# Patient Record
Sex: Female | Born: 1970 | Race: White | Hispanic: No | Marital: Married | Smoking: Never smoker
Health system: Southern US, Community
[De-identification: ages and names within clinical notes are randomized; demographics above are authoritative.]

## PROBLEM LIST (undated history)

## (undated) DIAGNOSIS — R569 Unspecified convulsions: Secondary | ICD-10-CM

## (undated) DIAGNOSIS — C4490 Unspecified malignant neoplasm of skin, unspecified: Secondary | ICD-10-CM

## (undated) DIAGNOSIS — T7840XA Allergy, unspecified, initial encounter: Secondary | ICD-10-CM

## (undated) DIAGNOSIS — D649 Anemia, unspecified: Secondary | ICD-10-CM

## (undated) DIAGNOSIS — J45909 Unspecified asthma, uncomplicated: Secondary | ICD-10-CM

## (undated) DIAGNOSIS — G43909 Migraine, unspecified, not intractable, without status migrainosus: Secondary | ICD-10-CM

## (undated) DIAGNOSIS — F419 Anxiety disorder, unspecified: Secondary | ICD-10-CM

## (undated) DIAGNOSIS — D689 Coagulation defect, unspecified: Secondary | ICD-10-CM

## (undated) DIAGNOSIS — K219 Gastro-esophageal reflux disease without esophagitis: Secondary | ICD-10-CM

## (undated) HISTORY — DX: Migraine, unspecified, not intractable, without status migrainosus: G43.909

## (undated) HISTORY — DX: Anxiety disorder, unspecified: F41.9

## (undated) HISTORY — DX: Gastro-esophageal reflux disease without esophagitis: K21.9

## (undated) HISTORY — DX: Unspecified malignant neoplasm of skin, unspecified: C44.90

## (undated) HISTORY — PX: TUBAL LIGATION: SHX77

## (undated) HISTORY — DX: Coagulation defect, unspecified: D68.9

## (undated) HISTORY — PX: ANKLE SURGERY: SHX546

## (undated) HISTORY — DX: Allergy, unspecified, initial encounter: T78.40XA

## (undated) HISTORY — PX: BASAL CELL CARCINOMA EXCISION: SHX1214

## (undated) HISTORY — DX: Unspecified asthma, uncomplicated: J45.909

## (undated) HISTORY — PX: NECK SURGERY: SHX720

## (undated) HISTORY — PX: ENDOMETRIAL ABLATION: SHX621

## (undated) HISTORY — DX: Anemia, unspecified: D64.9

## (undated) HISTORY — DX: Unspecified convulsions: R56.9

## (undated) HISTORY — PX: EXPLORATORY LAPAROTOMY: SUR591

---

## 2017-06-17 HISTORY — PX: CERVICAL SPINE SURGERY: SHX589

## 2018-02-21 ENCOUNTER — Encounter: Payer: Self-pay | Admitting: Diagnostic Neuroimaging

## 2018-02-25 ENCOUNTER — Ambulatory Visit: Payer: No Typology Code available for payment source | Admitting: Diagnostic Neuroimaging

## 2018-02-25 ENCOUNTER — Encounter: Payer: Self-pay | Admitting: Diagnostic Neuroimaging

## 2018-02-25 VITALS — BP 113/65 | HR 66 | Ht 63.0 in | Wt 123.2 lb

## 2018-02-25 DIAGNOSIS — F419 Anxiety disorder, unspecified: Secondary | ICD-10-CM | POA: Diagnosis not present

## 2018-02-25 DIAGNOSIS — M797 Fibromyalgia: Secondary | ICD-10-CM | POA: Diagnosis not present

## 2018-02-25 DIAGNOSIS — R200 Anesthesia of skin: Secondary | ICD-10-CM

## 2018-02-25 DIAGNOSIS — R413 Other amnesia: Secondary | ICD-10-CM

## 2018-02-25 NOTE — Progress Notes (Signed)
[facility]  PATIENT: Michele Ali DOB: 07-22-1971  REFERRING CLINICIAN: R York, NP HISTORY FROM: patient and chart review  REASON FOR VISIT: new consult    HISTORICAL  CHIEF COMPLAINT:  Chief Complaint  Patient presents with  . NP  Michele Scales NP  . cognitive impairment    memory loss, some shakiness UE and LE, Had sz [phone], took off meds.   . Migraine    HISTORY OF PRESENT ILLNESS:   47 year old female with depression, anxiety, fibromyalgia, migraine, here for evaluation of constellation of symptoms including memory loss, numbness and tingling, fatigue, tremor.  Patient had onset of symptoms around 2013-2014, when she was under high stress related to her child who had cancer diagnosis, as well as her in-laws who were having medical and stress problems.  By 2015 she started to have some cognitive difficulty, memory loss, extreme fatigue, intermittent tremors.  Patient has been on antidepressant and antianxiety medication by PCP, but has not seen psychiatry or psychology.  Patient also has history of seizures in the past (1979-1983) but no seizures recently.  Also history of headaches in the past but headaches have improved recently.  Patient following up here just to make sure that she does not have an alternate neurologic diagnosis.     REVIEW OF SYSTEMS: Full 14 system review of systems performed and negative with exception of: Memory loss confusion headache numbness weakness slurred speech dizziness restless legs depression anxiety decreased energy racing thoughts allergies runny nose urination problems joint pain feeling cold cough wheezing blurred vision double vision loss of vision eye pain fatigue palpitations spinning sensation.  ALLERGIES: Allergies  Allergen Reactions  . Latex   . Penicillins   . Sulfa Antibiotics     HOME MEDICATIONS: Outpatient Medications Prior to Visit  Medication Sig Dispense Refill  . ALBUTEROL SULFATE HFA IN  Inhale into the lungs as needed.    . Cholecalciferol (VITAMIN D-1000 MAX ST) 1000 units tablet Take 1,000 Units by mouth daily.     . clonazePAM (KLONOPIN) 0.5 MG tablet Take 0.5 mg by mouth 3 (three) times daily.    . fluticasone (FLONASE) 50 MCG/ACT nasal spray Place into the nose.    . loratadine (CLARITIN) 10 MG tablet Take 10 mg by mouth daily.    . meloxicam (MOBIC) 15 MG tablet takes daily  3  . methocarbamol (ROBAXIN) 750 MG tablet Take 750 mg by mouth as needed.     . Multiple Vitamin (MULTI-VITAMINS) TABS Take by mouth daily.     Marland Kitchen omeprazole (PRILOSEC) 20 MG capsule Take 20 mg by mouth daily.    . Ondansetron HCl (ZOFRAN PO) Take by mouth. Takes one prn nausea    . sertraline (ZOLOFT) 100 MG tablet Take 100 mg by mouth daily.    Marland Kitchen gabapentin (NEURONTIN) 300 MG capsule Take 300 mg by mouth 3 (three) times daily. 1 in am, 1 in afternoon, 2 at bedtime     No facility-administered medications prior to visit.     PAST MEDICAL HISTORY: Past Medical History:  Diagnosis Date  . Migraine   . Skin cancer    basal    PAST SURGICAL HISTORY: Past Surgical History:  Procedure Laterality Date  . ANKLE SURGERY Right   . BASAL CELL CARCINOMA EXCISION     2016  left flank,  . CERVICAL SPINE SURGERY  06/2017   C5-C7  . EXPLORATORY LAPAROTOMY    . NECK SURGERY     "lumpectomy"  FAMILY HISTORY: Family History  Problem Relation Age of Onset  . Crohn's disease Father   . Dementia Father   . Atrial fibrillation Father   . Heart disease Mother   . Cancer Maternal Grandmother   . Cancer Paternal Grandmother   . Pancreatic disease Paternal Grandfather     SOCIAL HISTORY:  Social History   Socioeconomic History  . Marital status: Married    Spouse name: Not on file  . Number of children: Not on file  . Years of education: Not on file  . Highest education level: Not on file  Social Needs  . Financial resource strain: Not on file  . Food insecurity - worry: Not on file    . Food insecurity - inability: Not on file  . Transportation needs - medical: Not on file  . Transportation needs - non-medical: Not on file  Occupational History  . Not on file  Tobacco Use  . Smoking status: Never Smoker  . Smokeless tobacco: Never Used  Substance and Sexual Activity  . Alcohol use: Yes    Frequency: Never    Comment: rarely  . Drug use: No  . Sexual activity: Not on file  Other Topics Concern  . Not on file  Social History Narrative   Lives at home, with husband and 2 kids and pets.  Works at [employer].  Michele Ali is spouse.  Caffeine 1 cup coffee daily.     PHYSICAL EXAM  GENERAL EXAM/CONSTITUTIONAL: Vitals:  Vitals:   02/25/18 1020  BP: 113/65  Pulse: 66  Weight: 123 lb 3.2 oz (55.9 kg)  Height: 5\' 3"  (1.6 m)     Body mass index is 21.82 kg/m.  Visual Acuity Screening   Right eye Left eye Both eyes  Without correction: 20/30 20/40   With correction:        Patient is in no distress; well developed, nourished and groomed; neck is supple  CARDIOVASCULAR:  Examination of carotid arteries is normal; no carotid bruits  Regular rate and rhythm, no murmurs  Examination of peripheral vascular system by observation and palpation is normal  EYES:  Ophthalmoscopic exam of optic discs and posterior segments is normal; no papilledema or hemorrhages  MUSCULOSKELETAL:  Gait, strength, tone, movements noted in Neurologic exam below  NEUROLOGIC: MENTAL STATUS:  MMSE - Mini Mental State Exam 02/25/2018  Orientation to time 5  Orientation to Place 4  Registration 3  Attention/ Calculation 5  Recall 1  Language- name 2 objects 2  Language- repeat 1  Language- follow 3 step command 2  Language- read & follow direction 1  Write a sentence 1  Copy design 1  Total score 26    awake, alert, oriented to person, place and time  recent and remote memory intact  normal attention and concentration  language fluent, comprehension  intact, naming intact,   fund of knowledge appropriate  CRANIAL NERVE:   2nd - no papilledema on fundoscopic exam  2nd, 3rd, 4th, 6th - pupils equal and reactive to light, visual fields full to confrontation, extraocular muscles intact, no nystagmus  5th - facial sensation symmetric  7th - facial strength symmetric  8th - hearing intact  9th - palate elevates symmetrically, uvula midline  11th - shoulder shrug symmetric  12th - tongue protrusion midline  MOTOR:   normal bulk and tone, full strength in the BUE, BLE  MINIMAL POSTURAL TREMOR  SENSORY:   normal and symmetric to light touch, temperature, vibration; DECR  TEMP IN LEFT HAND; DECR VIB IN RIGHT HAND  COORDINATION:   finger-nose-finger, fine finger movements normal  REFLEXES:   deep tendon reflexes present and symmetric  GAIT/STATION:   narrow based gait    DIAGNOSTIC DATA (LABS, IMAGING, TESTING) - I reviewed patient records, labs, notes, testing and imaging myself where available.  No results found for: WBC, HGB, HCT, MCV, PLT No results found for: NA, K, CL, CO2, GLUCOSE, BUN, CREATININE, CALCIUM, PROT, ALBUMIN, AST, ALT, ALKPHOS, BILITOT, GFRNONAA, GFRAA No results found for: CHOL, HDL, LDLCALC, LDLDIRECT, TRIG, CHOLHDL No results found for: HGBA1C No results found for: VITAMINB12 No results found for: TSH  09/02/15 MRI cervical spine [I reviewed images myself and agree with interpretation. -VRP]  1. The most significant disease is at C6-7 with moderate right and mild left foraminal stenosis. This potentially affects the C7 nerve roots. 2. Mild right foraminal narrowing at C5-6. 3. Mild central canal narrowing at C5-6 and C6-7 due to broad-based disc osteophyte complex at each level.  12/13/17 MRI brain [I reviewed images myself and agree with interpretation. -VRP]  1. No acute intracranial process. 2. Mild nonspecific white matter changes, slightly progressed from 2015 associated with chronic  small vessel ischemic disease, and migraines.    ASSESSMENT AND PLAN  47 y.o. year old female here with intermittent numbness, weakness, tremor, memory loss, pain, anxiety, in setting of increased psychosocial stressors.  Neurologic examination unremarkable.  MRI of the brain and cervical spine otherwise unremarkable.  We will check additional lab testing to rule out secondary causes.  Also recommend to follow-up with psychiatry or psychology to review better treatment of anxiety and stress factors.  Dx:  1. Numbness   2. Memory loss   3. Anxiety   4. Fibromyalgia     PLAN:  - check B12, TSH, A1c - consider psychology evaluation  Orders Placed This Encounter  Procedures  . Vitamin B12  . Hemoglobin A1c  . TSH   Return if symptoms worsen or fail to improve, for return to PCP.    Penni Bombard, MD 0/25/8527, 78:24 AM Certified in Neurology, Neurophysiology and Neuroimaging  [facility] [address] [place], [place] [address] [phone]

## 2018-02-26 ENCOUNTER — Telehealth: Payer: Self-pay | Admitting: *Deleted

## 2018-02-26 LAB — TSH: TSH: 1.64 u[IU]/mL (ref 0.450–4.500)

## 2018-02-26 LAB — HEMOGLOBIN A1C
ESTIMATED AVERAGE GLUCOSE: 103 mg/dL
Hgb A1c MFr Bld: 5.2 % (ref 4.8–5.6)

## 2018-02-26 LAB — VITAMIN B12: Vitamin B-12: 410 pg/mL (ref 232–1245)

## 2018-02-26 NOTE — Telephone Encounter (Signed)
-----   Message from Penni Bombard, MD sent at 02/26/2018 12:37 PM EDT ----- Normal labs. Please call patient. -VRP

## 2018-02-26 NOTE — Telephone Encounter (Signed)
Spoke to pt and relayed that her lab results were normal.  She asked about her MRI's being read by Dr Leta Baptist and I noted from ofv note he did review images and agreed with interpretation. Pt informed and she verbalized understanding of all above.

## 2020-01-29 NOTE — Progress Notes (Signed)
01/29/2020 Donalda Petree [id number] 1971-05-11   HISTORY OF PRESENT ILLNESS:  Leelani Nedley is a 49 year old female with a past medical history of depression, anxiety, fibromyalgia, generative disc disease and migraine headaches. Past tubal ligation and cervical spine fusion. She developed abrupt onset of lower abdominal pain while cleaning her bathroom on 01/18/2020. She felt nauseous without vomiting. She described her lower abdominal pain as excruciating. No specific food triggers. No fever or sweats. Her abdominal pain persisted and went to the urgent care clinic on Tuesday 01/20/2020. She was diagnosed with diverticulitis and she was prescribed Cipro 500mg  po bid and Flagyl 500mg  po tid x 10 days. Urinalysis was normal, no other labs were done. Her abdominal pain gradually improved. No further severe lower abdominal pain, however, she is having some cramping discomfort after eating. She is passing 2 to 4 soft formed stools daily since taking the antibiotics. She finished the Cipro and Flagyl on 2/11. No diarrhea. No rectal bleeding or melena. No mucous per the rectum. She has lost 5lbs over the past 3 weeks. She reports having a history of GERD. She stated she had reflux as an infant. She underwent an EGD at the age of 10 which showed acid reflux. She takes Omeprazole 40mg  QOD but she stopped taking it when she developed lower abdominal pain as documented above. She has occasional heartburn. She takes Meloxicam 15mg  once daily x 2 years for neck pain and fibromyalgia pain. No other NSAIDs. She reports having a history of anemia, further details unclear. Father with history of Crohn's disease. Mother with history of diverticulitis. Paternal uncle and paternal cousins with colon cancer. Maternal and paternal grandmother with breast cancer and paternal grandfather with history of pancreatic cancer.    Past Medical History:  Diagnosis Date  . Migraine   . Skin cancer    basal   Past Surgical History:    Procedure Laterality Date  . ANKLE SURGERY Right   . BASAL CELL CARCINOMA EXCISION     2016  left flank,  . CERVICAL SPINE SURGERY  06/2017   C5-C7  . EXPLORATORY LAPAROTOMY    . NECK SURGERY     "lumpectomy"    reports that she has never smoked. She has never used smokeless tobacco. She reports current alcohol use. She reports that she does not use drugs. family history includes Atrial fibrillation in her father; Cancer in her maternal grandmother and paternal grandmother; Crohn's disease in her father; Dementia in her father; Heart disease in her mother; Pancreatic disease in her paternal grandfather. Allergies  Allergen Reactions  . Latex   . Penicillins   . Sulfa Antibiotics       Outpatient Encounter Medications as of 01/30/2020  Medication Sig  . ALBUTEROL SULFATE HFA IN Inhale into the lungs as needed.  . Cholecalciferol (VITAMIN D-1000 MAX ST) 1000 units tablet Take 1,000 Units by mouth daily.   . clonazePAM (KLONOPIN) 0.5 MG tablet Take 0.5 mg by mouth 3 (three) times daily.  . fluticasone (FLONASE) 50 MCG/ACT nasal spray Place into the nose.  . loratadine (CLARITIN) 10 MG tablet Take 10 mg by mouth daily.  . meloxicam (MOBIC) 15 MG tablet takes daily  . methocarbamol (ROBAXIN) 750 MG tablet Take 750 mg by mouth as needed.   . Multiple Vitamin (MULTI-VITAMINS) TABS Take by mouth daily.   Marland Kitchen omeprazole (PRILOSEC) 20 MG capsule Take 20 mg by mouth daily.  . Ondansetron HCl (ZOFRAN PO) Take by mouth. Takes one prn nausea  .  sertraline (ZOLOFT) 100 MG tablet Take 100 mg by mouth daily.   No facility-administered encounter medications on file as of 01/30/2020.     REVIEW OF SYSTEMS  : All other systems reviewed and negative except where noted in the History of Present Illness.   PHYSICAL EXAM:  BP 100/68   Pulse 60   Temp 98 F (36.7 C)   Ht 5\' 2"  (1.575 m)   Wt 114 lb 2 oz (51.8 kg)   BMI 20.87 kg/m   General: Well developed 49 year old female in no acute  distress. Head: Normocephalic and atraumatic. Eyes:  Sclerae non-icteric, conjunctive pink. Ears: Normal auditory acuity. Mouth: Dentition intact with multiple fillings. No ulcers or lesions.  Neck: Supple, no lymphadenopathy or thyromegaly.  Lungs: Clear bilaterally to auscultation without wheezes, crackles or rhonchi. Heart: Regular rate and rhythm. No murmur, rub or gallop appreciated.  Abdomen: Soft. Nondistended. Mild tenderness RLQ and central lower abdomen without rebound or guarding.  No masses. No hepatosplenomegaly. Normoactive bowel sounds x 4 quadrants.  Rectal: Deferred.  Musculoskeletal: Symmetrical with no gross deformities. Skin: Warm and dry. No rash or lesions on visible extremities. Extremities: No edema. Neurological: Alert oriented x 4, no focal deficits.  Psychological:  Alert and cooperative. Normal mood and affect.  ASSESSMENT AND PLAN:  62. 49 year old female with lower abdominal pain treated with Cipro and Flagyl x 10 days  for presumed diverticulitis by the urgent care.  -CBC, CMP, CRP and celiac panel  -Discussed scheduling an abdominal/pelvic CT if her abdominal pain worsens. On exam today, she had very mild tenderness to the lower abdomen without rebound or guarding. She will call me on Monday 2/15 with an update, to the ED if she develops severe abdominal pain. -Hyoscyamine PRN as prescribed by urgent care -Colonoscopy in 6 to 8 weeks, benefits and risks discussed including risk with sedation, risk of bleeding, infection and perforation  -Advance diet as tolerated  -Phillip's bacteria probiotic once daily   2. GERD. Chronic NSAID use.  -Omeprazole 40mg  QD -GERD diet discussed -EGD at time of colonoscopy, benefits and risks discussed including risk with sedation, risk of bleeding, infection and perforation   3. History of anemia. -see plan in # 1 and #2  4. Family history of IBD (father with Crohn's disease).  5. Family history of breast, colon and  pancreatic cancer      CC:  York, Ronn Melena, NP

## 2020-01-30 ENCOUNTER — Encounter: Payer: Self-pay | Admitting: Nurse Practitioner

## 2020-01-30 ENCOUNTER — Other Ambulatory Visit (INDEPENDENT_AMBULATORY_CARE_PROVIDER_SITE_OTHER): Payer: No Typology Code available for payment source

## 2020-01-30 ENCOUNTER — Ambulatory Visit (INDEPENDENT_AMBULATORY_CARE_PROVIDER_SITE_OTHER): Payer: No Typology Code available for payment source | Admitting: Nurse Practitioner

## 2020-01-30 ENCOUNTER — Other Ambulatory Visit: Payer: Self-pay

## 2020-01-30 DIAGNOSIS — R1030 Lower abdominal pain, unspecified: Secondary | ICD-10-CM

## 2020-01-30 DIAGNOSIS — Z01818 Encounter for other preprocedural examination: Secondary | ICD-10-CM

## 2020-01-30 DIAGNOSIS — R109 Unspecified abdominal pain: Secondary | ICD-10-CM

## 2020-01-30 DIAGNOSIS — K219 Gastro-esophageal reflux disease without esophagitis: Secondary | ICD-10-CM

## 2020-01-30 DIAGNOSIS — K5732 Diverticulitis of large intestine without perforation or abscess without bleeding: Secondary | ICD-10-CM

## 2020-01-30 LAB — COMPREHENSIVE METABOLIC PANEL
ALT: 12 U/L (ref 0–35)
AST: 17 U/L (ref 0–37)
Albumin: 4.2 g/dL (ref 3.5–5.2)
Alkaline Phosphatase: 35 U/L — ABNORMAL LOW (ref 39–117)
BUN: 18 mg/dL (ref 6–23)
CO2: 29 mEq/L (ref 19–32)
Calcium: 8.5 mg/dL (ref 8.4–10.5)
Chloride: 102 mEq/L (ref 96–112)
Creatinine, Ser: 0.73 mg/dL (ref 0.40–1.20)
GFR: 85.18 mL/min (ref >60.00)
Glucose, Bld: 98 mg/dL (ref 70–99)
Potassium: 4 mEq/L (ref 3.5–5.1)
Sodium: 137 mEq/L (ref 135–145)
Total Bilirubin: 0.4 mg/dL (ref 0.2–1.2)
Total Protein: 7 g/dL (ref 6.0–8.3)

## 2020-01-30 LAB — CBC WITH DIFFERENTIAL/PLATELET
Basophils Absolute: 0.1 10*3/uL (ref 0.0–0.1)
Basophils Relative: 0.6 % (ref 0.0–3.0)
Eosinophils Absolute: 0.1 10*3/uL (ref 0.0–0.7)
Eosinophils Relative: 1.2 % (ref 0.0–5.0)
HCT: 37.6 % (ref 36.0–46.0)
Hemoglobin: 12.4 g/dL (ref 12.0–15.0)
Lymphocytes Relative: 14.1 % (ref 12.0–46.0)
Lymphs Abs: 1.4 10*3/uL (ref 0.7–4.0)
MCHC: 33.1 g/dL (ref 30.0–36.0)
MCV: 88.1 fl (ref 78.0–100.0)
Monocytes Absolute: 1 10*3/uL (ref 0.1–1.0)
Monocytes Relative: 9.7 % (ref 3.0–12.0)
Neutro Abs: 7.6 10*3/uL (ref 1.4–7.7)
Neutrophils Relative %: 74.4 % (ref 43.0–77.0)
Platelets: 285 10*3/uL (ref 150.0–400.0)
RBC: 4.26 Mil/uL (ref 3.87–5.11)
RDW: 12.8 % (ref 11.5–15.5)
WBC: 10.3 10*3/uL (ref 4.0–10.5)

## 2020-01-30 LAB — IGA: IgA: 154 mg/dL (ref 68–378)

## 2020-01-30 LAB — C-REACTIVE PROTEIN: CRP: <1 mg/dL (ref 0.5–20.0)

## 2020-01-30 MED ORDER — OMEPRAZOLE 40 MG PO CPDR: 40.0000 mg | DELAYED_RELEASE_CAPSULE | Freq: Every day | ORAL | 0 refills | Status: DC

## 2020-01-30 MED ORDER — SUPREP BOWEL PREP KIT 17.5-3.13-1.6 GM/177ML PO SOLN
1.0000 | ORAL | 0 refills | Status: DC
Start: 2020-01-30 — End: 2020-05-20

## 2020-01-30 NOTE — Patient Instructions (Addendum)
If you are age 48 or older, your body mass index should be between 23-30. Your Body mass index is 20.87 kg/m. If this is out of the aforementioned range listed, please consider follow up with your Primary Care Provider.  If you are age 45 or younger, your body mass index should be between 19-25. Your Body mass index is 20.87 kg/m. If this is out of the aformentioned range listed, please consider follow up with your Primary Care Provider.   Your provider has requested that you go to the basement level for lab work before leaving today. Press "B" on the elevator. The lab is located at the first door on the left as you exit the elevator.  Due to recent changes in healthcare laws, you may see the results of your imaging and laboratory studies on MyChart before your provider has had a chance to review them.  We understand that in some cases there may be results that are confusing or concerning to you. Not all laboratory results come back in the same time frame and the provider may be waiting for multiple results in order to interpret others.  Please give [place] 48 hours in order for your provider to thoroughly review all the results before contacting the office for clarification of your results.   You have been scheduled for an endoscopy and colonoscopy. Please follow the written instructions given to you at your visit today. Please pick up your prep supplies at the pharmacy within the next 1-3 days. If you use inhalers (even only as needed), please bring them with you on the day of your procedure.  START: Omeprazole 40mg  one capsule daily  START: Hardin Negus Bacteria probiotic daily   Follow advanced diet as tolerated.  Call office on Monday to speak with Coleen for an update.     Food Choices for Gastroesophageal Reflux Disease, Adult When you have gastroesophageal reflux disease (GERD), the foods you eat and your eating habits are very important. Choosing the right foods can help ease your  discomfort. Think about working with a nutrition specialist (dietitian) to help you make good choices. What are tips for following this plan?  Meals  Choose healthy foods that are low in fat, such as fruits, vegetables, whole grains, low-fat dairy products, and lean meat, fish, and poultry.  Eat small meals often instead of 3 large meals a day. Eat your meals slowly, and in a place where you are relaxed. Avoid bending over or lying down until 2-3 hours after eating.  Avoid eating meals 2-3 hours before bed.  Avoid drinking a lot of liquid with meals.  Cook foods using methods other than frying. Bake, grill, or broil food instead.  Avoid or limit: ? Chocolate. ? Peppermint or spearmint. ? Alcohol. ? Pepper. ? Black and decaffeinated coffee. ? Black and decaffeinated tea. ? Bubbly (carbonated) soft drinks. ? Caffeinated energy drinks and soft drinks.  Limit high-fat foods such as: ? Fatty meat or fried foods. ? Whole milk, cream, butter, or ice cream. ? Nuts and nut butters. ? Pastries, donuts, and sweets made with butter or shortening.  Avoid foods that cause symptoms. These foods may be different for everyone. Common foods that cause symptoms include: ? Tomatoes. ? Oranges, lemons, and limes. ? Peppers. ? Spicy food. ? Onions and garlic. ? Vinegar. Lifestyle  Maintain a healthy weight. Ask your doctor what weight is healthy for you. If you need to lose weight, work with your doctor to do so safely.  Exercise  for at least 30 minutes for 5 or more days each week, or as told by your doctor.  Wear loose-fitting clothes.  Do not smoke. If you need help quitting, ask your doctor.  Sleep with the head of your bed higher than your feet. Use a wedge under the mattress or blocks under the bed frame to raise the head of the bed. Summary  When you have gastroesophageal reflux disease (GERD), food and lifestyle choices are very important in easing your symptoms.  Eat small  meals often instead of 3 large meals a day. Eat your meals slowly, and in a place where you are relaxed.  Limit high-fat foods such as fatty meat or fried foods.  Avoid bending over or lying down until 2-3 hours after eating.  Avoid peppermint and spearmint, caffeine, alcohol, and chocolate. This information is not intended to replace advice given to you by your health care provider. Make sure you discuss any questions you have with your health care provider. Document Revised: 03/27/2019 Document Reviewed: 01/09/2017 Elsevier Patient Education  [place].

## 2020-01-31 NOTE — Progress Notes (Signed)
Attending Physician's Attestation   I have reviewed the chart.   I agree with the Advanced Practitioner's note, impression, and recommendations with any updates as below.  No clear diagnostic imaging to suggest overt diverticulosis/diverticulitis by clinical history somewhat concerning for him.  Agree if symptoms persist or do not improve significantly/normalized then cross-sectional imaging should be pursued prior to colonoscopy.  Otherwise agree with colonoscopy in 4 to 8 weeks.  For evaluation.  Justice Britain, MD [facility]oscopy Office # [id number]

## 2020-02-02 LAB — TISSUE TRANSGLUTAMINASE, IGA: (tTG) Ab, IgA: 1 U/mL

## 2020-02-03 ENCOUNTER — Telehealth: Payer: Self-pay | Admitting: Nurse Practitioner

## 2020-02-03 NOTE — Telephone Encounter (Signed)
I called the patient for an update. She is having less lower abd pain, a few twinges of sharp LLQ pain later in the evening/night time but not severe abdominal pain. She feels this pain is digestive in nature. She will call me in 2 days, if she continues to have any abdominal pain then I advised scheduling an abd/pelvic CT, to be done prior to her proceeding with a colonoscopy. She agreed to call me in 2 days with an update.

## 2020-02-09 ENCOUNTER — Telehealth: Payer: Self-pay | Admitting: Nurse Practitioner

## 2020-02-09 ENCOUNTER — Other Ambulatory Visit (INDEPENDENT_AMBULATORY_CARE_PROVIDER_SITE_OTHER): Payer: No Typology Code available for payment source

## 2020-02-09 DIAGNOSIS — R1030 Lower abdominal pain, unspecified: Secondary | ICD-10-CM

## 2020-02-09 DIAGNOSIS — K219 Gastro-esophageal reflux disease without esophagitis: Secondary | ICD-10-CM

## 2020-02-09 DIAGNOSIS — R109 Unspecified abdominal pain: Secondary | ICD-10-CM

## 2020-02-09 LAB — CBC WITH DIFFERENTIAL/PLATELET
Basophils Absolute: 0 10*3/uL (ref 0.0–0.1)
Basophils Relative: 0.6 % (ref 0.0–3.0)
Eosinophils Absolute: 0.1 10*3/uL (ref 0.0–0.7)
Eosinophils Relative: 1.4 % (ref 0.0–5.0)
HCT: 33.9 % — ABNORMAL LOW (ref 36.0–46.0)
Hemoglobin: 11.4 g/dL — ABNORMAL LOW (ref 12.0–15.0)
Lymphocytes Relative: 26 % (ref 12.0–46.0)
Lymphs Abs: 1.9 10*3/uL (ref 0.7–4.0)
MCHC: 33.6 g/dL (ref 30.0–36.0)
MCV: 87.5 fl (ref 78.0–100.0)
Monocytes Absolute: 0.7 10*3/uL (ref 0.1–1.0)
Monocytes Relative: 9.6 % (ref 3.0–12.0)
Neutro Abs: 4.6 10*3/uL (ref 1.4–7.7)
Neutrophils Relative %: 62.4 % (ref 43.0–77.0)
Platelets: 267 10*3/uL (ref 150.0–400.0)
RBC: 3.87 Mil/uL (ref 3.87–5.11)
RDW: 12.9 % (ref 11.5–15.5)
WBC: 7.4 10*3/uL (ref 4.0–10.5)

## 2020-02-09 NOTE — Telephone Encounter (Signed)
Called and spoke with patient-patient informed of provider's recommendations; patient is agreeable with plan of care and will complete lab work at [facility] lab today; patient reports she would like to have the CT scan scheduled at [facility] CT-RN called and spoke with Marzetta Board at [place] [facility]-she will contact the patient to schedule the CT abd/pel -patient verbalized understanding of information/instructions and reports she will pick up the contrast from [place] today after completing lab work; patient was advised to call the office at [phone] if questions/concerns arise;

## 2020-02-09 NOTE — Telephone Encounter (Signed)
Bre, pls call the patient and schedule an abd/pelvic CT with oral and IV contrast asap. Repeat CBC and BMP. Patient is having sharp LLQ pain, sometimes on the right which is a new development since her office visit. Some nausea, no vomiting. Abd bloat. She passed a soft BM earlier this morning. Pt is aware to go to the ED if she develops vomiting or severe abd pain  Also, patient stated she called the answering service on Thursday 2/18 when office was close, I do not see any documentation for this phone call?

## 2020-02-09 NOTE — Telephone Encounter (Signed)
Called and spoke with patient-patient reports she is still having sharp pains on L abd and pressure to go the restroom (not really able to go/have to go; sometimes the pains are happening on the R; provider had requested update-please advise

## 2020-02-10 ENCOUNTER — Other Ambulatory Visit (INDEPENDENT_AMBULATORY_CARE_PROVIDER_SITE_OTHER): Payer: No Typology Code available for payment source

## 2020-02-10 DIAGNOSIS — D649 Anemia, unspecified: Secondary | ICD-10-CM | POA: Diagnosis not present

## 2020-02-10 LAB — IRON: Iron: 62 ug/dL (ref 42–145)

## 2020-02-10 LAB — BASIC METABOLIC PANEL
BUN: 15 mg/dL (ref 6–23)
CO2: 29 mEq/L (ref 19–32)
Calcium: 9 mg/dL (ref 8.4–10.5)
Chloride: 97 mEq/L (ref 96–112)
Creatinine, Ser: 0.71 mg/dL (ref 0.40–1.20)
GFR: 87.95 mL/min (ref >60.00)
Glucose, Bld: 88 mg/dL (ref 70–99)
Potassium: 4.4 mEq/L (ref 3.5–5.1)
Sodium: 133 mEq/L — ABNORMAL LOW (ref 135–145)

## 2020-02-10 LAB — IBC PANEL
Iron: 62 ug/dL (ref 42–145)
Saturation Ratios: 30.3 % (ref 20.0–50.0)
Transferrin: 146 mg/dL — ABNORMAL LOW (ref 212.0–360.0)

## 2020-02-11 LAB — VITAMIN B12: Vitamin B-12: 274 pg/mL (ref 211–911)

## 2020-02-11 LAB — FERRITIN: Ferritin: 38.6 ng/mL (ref 10.0–291.0)

## 2020-02-12 ENCOUNTER — Other Ambulatory Visit: Payer: Self-pay

## 2020-02-12 ENCOUNTER — Ambulatory Visit (INDEPENDENT_AMBULATORY_CARE_PROVIDER_SITE_OTHER)
Admission: RE | Admit: 2020-02-12 | Discharge: 2020-02-12 | Disposition: A | Discharge status: Home / Self Care | Payer: No Typology Code available for payment source | Source: Ambulatory Visit | Attending: Nurse Practitioner | Admitting: Nurse Practitioner

## 2020-02-12 DIAGNOSIS — R1030 Lower abdominal pain, unspecified: Secondary | ICD-10-CM

## 2020-02-12 DIAGNOSIS — N83201 Unspecified ovarian cyst, right side: Secondary | ICD-10-CM

## 2020-02-12 DIAGNOSIS — R109 Unspecified abdominal pain: Secondary | ICD-10-CM

## 2020-02-12 DIAGNOSIS — K219 Gastro-esophageal reflux disease without esophagitis: Secondary | ICD-10-CM

## 2020-02-12 DIAGNOSIS — N83202 Unspecified ovarian cyst, left side: Secondary | ICD-10-CM

## 2020-02-12 MED ORDER — IOHEXOL 300 MG/ML  SOLN
100.0000 mL | Freq: Once | INTRAMUSCULAR | Status: AC | PRN
  Administered 2020-02-12: 100 mL via INTRAVENOUS

## 2020-02-12 NOTE — Telephone Encounter (Signed)
Pt stated that [facility] does not have availability for another three weeks.  She inquired whether she can be referred to another location who can see her sooner.

## 2020-02-13 NOTE — Telephone Encounter (Signed)
Please advise if the patient can wait 3 weeks (as this appt is on March 18th) or does she need to be seen at an alternative facility; do you have a specific facility/provider she needs to be see by-sooner? Please advise as the patient reports "I was told by Jaclyn Shaggy I needed to be see within the next week";

## 2020-02-13 NOTE — Telephone Encounter (Signed)
Bre, I don't have other gyn to refer to. If she cannot be seen in next 2 weeks then pls send pt to lab for blood pregnancy test: beta hcg quant next week, very unlikely she is pregnant as she had tubal ligation. Thx

## 2020-02-16 NOTE — Telephone Encounter (Signed)
Excellent

## 2020-02-16 NOTE — Telephone Encounter (Signed)
Patient returned call to the office on 02/13/2020 and has requested she be referred to Dr. Gertie Fey-  Called and spoke with Dr. Clementeen Hoof office-patient has been scheduled to see Dr. Gertie Fey on 02/17/2020 at 10:10 am; paperwork was faxed to this office on 02/13/2020 and patient is aware of appt;

## 2020-03-04 ENCOUNTER — Ambulatory Visit: Payer: No Typology Code available for payment source | Admitting: Obstetrics and Gynecology

## 2020-03-18 ENCOUNTER — Encounter: Payer: No Typology Code available for payment source | Admitting: Gastroenterology

## 2020-03-26 ENCOUNTER — Encounter: Payer: Self-pay | Admitting: Gastroenterology

## 2020-04-09 ENCOUNTER — Encounter: Payer: No Typology Code available for payment source | Admitting: Gastroenterology

## 2020-05-18 ENCOUNTER — Ambulatory Visit (INDEPENDENT_AMBULATORY_CARE_PROVIDER_SITE_OTHER): Payer: No Typology Code available for payment source

## 2020-05-18 ENCOUNTER — Other Ambulatory Visit: Payer: Self-pay | Admitting: Gastroenterology

## 2020-05-18 DIAGNOSIS — K625 Hemorrhage of anus and rectum: Secondary | ICD-10-CM

## 2020-05-18 DIAGNOSIS — Z1159 Encounter for screening for other viral diseases: Secondary | ICD-10-CM

## 2020-05-18 HISTORY — DX: Hemorrhage of anus and rectum: K62.5

## 2020-05-18 LAB — SARS CORONAVIRUS 2 (TAT 6-24 HRS): SARS Coronavirus 2: NEGATIVE

## 2020-05-20 ENCOUNTER — Encounter: Payer: Self-pay | Admitting: Gastroenterology

## 2020-05-20 ENCOUNTER — Other Ambulatory Visit: Payer: Self-pay

## 2020-05-20 ENCOUNTER — Ambulatory Visit (AMBULATORY_SURGERY_CENTER): Payer: No Typology Code available for payment source | Admitting: Gastroenterology

## 2020-05-20 VITALS — BP 125/78 | HR 78 | Temp 97.3°F | Resp 16 | Ht 62.0 in | Wt 114.0 lb

## 2020-05-20 DIAGNOSIS — K219 Gastro-esophageal reflux disease without esophagitis: Secondary | ICD-10-CM

## 2020-05-20 DIAGNOSIS — D127 Benign neoplasm of rectosigmoid junction: Secondary | ICD-10-CM

## 2020-05-20 DIAGNOSIS — K644 Residual hemorrhoidal skin tags: Secondary | ICD-10-CM

## 2020-05-20 DIAGNOSIS — K317 Polyp of stomach and duodenum: Secondary | ICD-10-CM

## 2020-05-20 DIAGNOSIS — K2950 Unspecified chronic gastritis without bleeding: Secondary | ICD-10-CM

## 2020-05-20 DIAGNOSIS — R109 Unspecified abdominal pain: Secondary | ICD-10-CM

## 2020-05-20 DIAGNOSIS — K319 Disease of stomach and duodenum, unspecified: Secondary | ICD-10-CM | POA: Diagnosis not present

## 2020-05-20 DIAGNOSIS — D122 Benign neoplasm of ascending colon: Secondary | ICD-10-CM

## 2020-05-20 DIAGNOSIS — K641 Second degree hemorrhoids: Secondary | ICD-10-CM

## 2020-05-20 DIAGNOSIS — K635 Polyp of colon: Secondary | ICD-10-CM | POA: Diagnosis not present

## 2020-05-20 HISTORY — PX: COLONOSCOPY: SHX174

## 2020-05-20 MED ORDER — SODIUM CHLORIDE 0.9 % IV SOLN: 500.0000 mL | Freq: Once | INTRAVENOUS | Status: DC

## 2020-05-20 NOTE — Progress Notes (Signed)
Called to room to assist during endoscopic procedure.  Patient ID and intended procedure confirmed with present staff. Received instructions for my participation in the procedure from the performing physician.  

## 2020-05-20 NOTE — Op Note (Signed)
[facility] Patient Name: Michele Ali Procedure Date: 05/20/2020 1:33 PM MRN: [id number] Endoscopist: Justice Britain , MD Age: 49 Referring MD:  Date of Birth: Sep 17, 1971 Gender: Female Account #: [id number] Procedure:                Colonoscopy Indications:              Screening for colorectal malignant neoplasm,                            Incidental abdominal pain noted Medicines:                Monitored Anesthesia Care Procedure:                Pre-Anesthesia Assessment:                           - Prior to the procedure, a History and Physical                            was performed, and patient medications and                            allergies were reviewed. The patient's tolerance of                            previous anesthesia was also reviewed. The risks                            and benefits of the procedure and the sedation                            options and risks were discussed with the patient.                            All questions were answered, and informed consent                            was obtained. Prior Anticoagulants: The patient has                            taken no previous anticoagulant or antiplatelet                            agents except for NSAID medication. ASA Grade                            Assessment: II - A patient with mild systemic                            disease. After reviewing the risks and benefits,                            the patient was deemed in satisfactory condition to  undergo the procedure.                           After obtaining informed consent, the colonoscope                            was passed under direct vision. Throughout the                            procedure, the patient's blood pressure, pulse, and                            oxygen saturations were monitored continuously. The                            Colonoscope was introduced through the anus and                        advanced to the 5 cm into the ileum. The                            colonoscopy was performed without difficulty. The                            patient tolerated the procedure. The quality of the                            bowel preparation was good. The terminal ileum,                            ileocecal valve, appendiceal orifice, and rectum                            were photographed. Scope In: 1:49:45 PM Scope Out: 2:24:17 PM Scope Withdrawal Time: 0 hours 29 minutes 57 seconds  Total Procedure Duration: 0 hours 34 minutes 32 seconds  Findings:                 Skin tags were found on perianal exam.                           The digital rectal exam findings include                            hemorrhoids. Pertinent negatives include no                            palpable rectal lesions.                           The terminal ileum and ileocecal valve appeared                            normal.                           A  15 mm polyp was found in the proximal ascending                            colon. The polyp was sessile. The polyp was removed                            with a lift and cut technique using a cold snare.                            Resection and retrieval were complete.                           A 13 mm polyp was found in the recto-sigmoid colon.                            The polyp was pedunculated. Preparations were made                            for mucosal resection. Saline was injected to raise                            the lesion. Snare mucosal resection was performed.                            Resection and retrieval were complete.                           Normal mucosa was found in the entire colon                            otherwise.                           Non-bleeding non-thrombosed internal hemorrhoids                            were found during retroflexion, during perianal                            exam and during digital  exam. The hemorrhoids were                            Grade II (internal hemorrhoids that prolapse but                            reduce spontaneously). Complications:            No immediate complications. Estimated Blood Loss:     Estimated blood loss was minimal. Impression:               - Perianal skin tags and hemorrhoids were found on                            perianal exam.                           -  The examined portion of the ileum was normal.                           - One 15 mm polyp in the proximal ascending colon,                            removed using lift and cut and a cold snare.                            Resected and retrieved.                           - One 13 mm polyp at the recto-sigmoid colon,                            removed with mucosal resection. Resected and                            retrieved.                           - Normal mucosa in the entire examined colon                            otherwise.                           - Non-bleeding non-thrombosed internal hemorrhoids. Recommendation:           - The patient will be observed post-procedure,                            until all discharge criteria are met.                           - Discharge patient to home.                           - Patient has a contact number available for                            emergencies. The signs and symptoms of potential                            delayed complications were discussed with the                            patient. Return to normal activities tomorrow.                            Written discharge instructions were provided to the                            patient.                           -  High fiber diet.                           - Use FiberCon 1 tablet PO daily.                           - Continue present medications.                           - Await pathology results.                           - Monitor for signs/symptoms of bleeding,                             perforation, infection.                           - Repeat colonoscopy in likely 3 years for                            surveillance.                           - Follow up in clinic in 6-8 weeks or as needed.                           - The findings and recommendations were discussed                            with the patient. Justice Britain, MD 05/20/2020 2:40:24 PM

## 2020-05-20 NOTE — Progress Notes (Signed)
Temp JB V/S CW 

## 2020-05-20 NOTE — Patient Instructions (Addendum)
YOU HAD AN ENDOSCOPIC PROCEDURE TODAY AT [facility]:   Refer to the procedure report that was given to you for any specific questions about what was found during the examination.  If the procedure report does not answer your questions, please call your gastroenterologist to clarify.  If you requested that your care partner not be given the details of your procedure findings, then the procedure report has been included in a sealed envelope for you to review at your convenience later.  YOU SHOULD EXPECT: Some feelings of bloating in the abdomen. Passage of more gas than usual.  Walking can help get rid of the air that was put into your GI tract during the procedure and reduce the bloating. If you had a lower endoscopy (such as a colonoscopy or flexible sigmoidoscopy) you may notice spotting of blood in your stool or on the toilet paper. If you underwent a bowel prep for your procedure, you may not have a normal bowel movement for a few days.  Please Note:  You might notice some irritation and congestion in your nose or some drainage.  This is from the oxygen used during your procedure.  There is no need for concern and it should clear up in a day or so.  SYMPTOMS TO REPORT IMMEDIATELY:   Following lower endoscopy (colonoscopy or flexible sigmoidoscopy):  Excessive amounts of blood in the stool  Significant tenderness or worsening of abdominal pains  Swelling of the abdomen that is new, acute  Fever of 100F or higher   Following upper endoscopy (EGD)  Vomiting of blood or coffee ground material  New chest pain or pain under the shoulder blades  Painful or persistently difficult swallowing  New shortness of breath  Fever of 100F or higher  Black, tarry-looking stools  For urgent or emergent issues, a gastroenterologist can be reached at any hour by calling [phone]. Do not use MyChart messaging for urgent concerns.    DIET:  We do recommend a small meal at first, but  then you may proceed to your regular diet.  Drink plenty of fluids but you should avoid alcoholic beverages for 24 hours.  ACTIVITY:  You should plan to take it easy for the rest of today and you should NOT DRIVE or use heavy machinery until tomorrow (because of the sedation medicines used during the test).    FOLLOW UP: Our staff will call the number listed on your records 48-72 hours following your procedure to check on you and address any questions or concerns that you may have regarding the information given to you following your procedure. If we do not reach you, we will leave a message.  We will attempt to reach you two times.  During this call, we will ask if you have developed any symptoms of COVID 19. If you develop any symptoms (ie: fever, flu-like symptoms, shortness of breath, cough etc.) before then, please call [phone].  If you test positive for Covid 19 in the 2 weeks post procedure, please call and report this information to [place].    If any biopsies were taken you will be contacted by phone or by letter within the next 1-3 weeks.  Please call us at [phone] if you have not heard about the biopsies in 3 weeks.    SIGNATURES/CONFIDENTIALITY: You and/or your care partner have signed paperwork which will be entered into your electronic medical record.  These signatures attest to the fact that that the information above on  your After Visit Summary has been reviewed and is understood.  Full responsibility of the confidentiality of this discharge information lies with you and/or your care-partner.   Resume medications. Recommend high fiber diet,fibercon 1 tablet daily. Follow up in clinic in 6-8 weeks or as needed. Information given on high fiber diet,polyps.

## 2020-05-20 NOTE — Op Note (Signed)
[facility] Patient Name: Michele Ali Procedure Date: 05/20/2020 1:33 PM MRN: [id number] Endoscopist: Justice Britain , MD Age: 49 Referring MD:  Date of Birth: 1971/10/13 Gender: Female Account #: [id number] Procedure:                Upper GI endoscopy Indications:              Epigastric abdominal pain, Generalized abdominal                            pain, Heartburn, Gastro-esophageal reflux disease Medicines:                Monitored Anesthesia Care Procedure:                Pre-Anesthesia Assessment:                           - Prior to the procedure, a History and Physical                            was performed, and patient medications and                            allergies were reviewed. The patient's tolerance of                            previous anesthesia was also reviewed. The risks                            and benefits of the procedure and the sedation                            options and risks were discussed with the patient.                            All questions were answered, and informed consent                            was obtained. Prior Anticoagulants: The patient has                            taken no previous anticoagulant or antiplatelet                            agents except for NSAID medication. ASA Grade                            Assessment: II - A patient with mild systemic                            disease. After reviewing the risks and benefits,                            the patient was deemed in satisfactory condition to  undergo the procedure.                           After obtaining informed consent, the endoscope was                            passed under direct vision. Throughout the                            procedure, the patient's blood pressure, pulse, and                            oxygen saturations were monitored continuously. The                            Endoscope was introduced  through the mouth, and                            advanced to the second part of duodenum. The upper                            GI endoscopy was accomplished without difficulty.                            The patient tolerated the procedure. Scope In: Scope Out: Findings:                 No gross lesions were noted in the proximal                            esophagus and in the mid esophagus.                           Scattered islands of salmon-colored mucosa were                            present at 36 cm. No other visible abnormalities                            were present. Biopsies were taken with a cold                            forceps for histology to rule in/out Barrett's.                           The Z-line was irregular and was found 36 cm from                            the incisors.                           Multiple small semi-sessile polyps with no bleeding                            and no stigmata of recent  bleeding were found in                            the cardia, in the gastric fundus and in the                            gastric body - consistent with fundic gland polyps.                            Biopsies were taken with a cold forceps for                            histology to rule out adenomatous tissue.                           No other gross lesions were noted in the entire                            examined stomach. Biopsies were taken with a cold                            forceps for histology and Helicobacter pylori                            testing.                           No gross lesions were noted in the duodenal bulb,                            in the first portion of the duodenum and in the                            second portion of the duodenum. Complications:            No immediate complications. Estimated Blood Loss:     Estimated blood loss was minimal. Impression:               - No gross lesions in esophagus proximally.                             Salmon-colored mucosa island suspicious for                            Barrett's esophagus. Biopsied.                           - Z-line irregular, 36 cm from the incisors.                           - Multiple gastric polyps - fundic gland appearing.                            Biopsied.                           -  No other gross lesions in the stomach. Biopsied.                           - No gross lesions in the duodenal bulb, in the                            first portion of the duodenum and in the second                            portion of the duodenum. Recommendation:           - Proceed to scheduled colonoscopy.                           - Await pathology results.                           - Continue present medications.                           - The findings and recommendations were discussed                            with the patient. Justice Britain, MD 05/20/2020 2:33:42 PM

## 2020-05-20 NOTE — Progress Notes (Signed)
Report to PACU, RN, vss, BBS= Clear.  

## 2020-05-21 ENCOUNTER — Encounter (HOSPITAL_COMMUNITY): Payer: Self-pay | Admitting: Emergency Medicine

## 2020-05-21 ENCOUNTER — Other Ambulatory Visit: Payer: Self-pay | Admitting: Gastroenterology

## 2020-05-21 ENCOUNTER — Encounter (HOSPITAL_COMMUNITY)
Admission: EM | Disposition: A | Discharge status: Home / Self Care | Payer: Self-pay | Source: Home / Self Care | Attending: Emergency Medicine

## 2020-05-21 ENCOUNTER — Observation Stay (HOSPITAL_COMMUNITY): Payer: No Typology Code available for payment source | Admitting: Anesthesiology

## 2020-05-21 ENCOUNTER — Other Ambulatory Visit: Payer: Self-pay

## 2020-05-21 ENCOUNTER — Telehealth: Payer: Self-pay | Admitting: Gastroenterology

## 2020-05-21 ENCOUNTER — Observation Stay (HOSPITAL_COMMUNITY)
Admission: EM | Admit: 2020-05-21 | Discharge: 2020-05-22 | Disposition: A | Discharge status: Home / Self Care | Payer: No Typology Code available for payment source | Attending: Internal Medicine | Admitting: Internal Medicine

## 2020-05-21 DIAGNOSIS — Z791 Long term (current) use of non-steroidal anti-inflammatories (NSAID): Secondary | ICD-10-CM | POA: Diagnosis not present

## 2020-05-21 DIAGNOSIS — Z9889 Other specified postprocedural states: Secondary | ICD-10-CM

## 2020-05-21 DIAGNOSIS — R5383 Other fatigue: Secondary | ICD-10-CM | POA: Diagnosis present

## 2020-05-21 DIAGNOSIS — J45909 Unspecified asthma, uncomplicated: Secondary | ICD-10-CM | POA: Diagnosis not present

## 2020-05-21 DIAGNOSIS — R1030 Lower abdominal pain, unspecified: Secondary | ICD-10-CM | POA: Insufficient documentation

## 2020-05-21 DIAGNOSIS — F419 Anxiety disorder, unspecified: Secondary | ICD-10-CM | POA: Diagnosis not present

## 2020-05-21 DIAGNOSIS — K625 Hemorrhage of anus and rectum: Secondary | ICD-10-CM | POA: Diagnosis present

## 2020-05-21 DIAGNOSIS — R42 Dizziness and giddiness: Secondary | ICD-10-CM | POA: Diagnosis not present

## 2020-05-21 DIAGNOSIS — Z20822 Contact with and (suspected) exposure to covid-19: Secondary | ICD-10-CM | POA: Insufficient documentation

## 2020-05-21 DIAGNOSIS — D62 Acute posthemorrhagic anemia: Secondary | ICD-10-CM | POA: Diagnosis present

## 2020-05-21 DIAGNOSIS — Z85828 Personal history of other malignant neoplasm of skin: Secondary | ICD-10-CM | POA: Insufficient documentation

## 2020-05-21 DIAGNOSIS — Z79899 Other long term (current) drug therapy: Secondary | ICD-10-CM | POA: Insufficient documentation

## 2020-05-21 DIAGNOSIS — K219 Gastro-esophageal reflux disease without esophagitis: Secondary | ICD-10-CM | POA: Diagnosis not present

## 2020-05-21 DIAGNOSIS — K91840 Postprocedural hemorrhage and hematoma of a digestive system organ or structure following a digestive system procedure: Principal | ICD-10-CM | POA: Insufficient documentation

## 2020-05-21 HISTORY — PX: COLONOSCOPY WITH PROPOFOL: SHX5780

## 2020-05-21 HISTORY — PX: HEMOSTASIS CLIP PLACEMENT: SHX6857

## 2020-05-21 LAB — COMPREHENSIVE METABOLIC PANEL
ALT: 16 U/L (ref 0–44)
AST: 16 U/L (ref 15–41)
Albumin: 3.5 g/dL (ref 3.5–5.0)
Alkaline Phosphatase: 28 U/L — ABNORMAL LOW (ref 38–126)
Anion gap: 8 (ref 5–15)
BUN: 12 mg/dL (ref 6–20)
CO2: 24 mmol/L (ref 22–32)
Calcium: 8.2 mg/dL — ABNORMAL LOW (ref 8.9–10.3)
Chloride: 106 mmol/L (ref 98–111)
Creatinine, Ser: 0.78 mg/dL (ref 0.44–1.00)
GFR calc Af Amer: >60 mL/min (ref >60)
GFR calc non Af Amer: >60 mL/min (ref >60)
Glucose, Bld: 108 mg/dL — ABNORMAL HIGH (ref 70–99)
Potassium: 3.7 mmol/L (ref 3.5–5.1)
Sodium: 138 mmol/L (ref 135–145)
Total Bilirubin: 0.9 mg/dL (ref 0.3–1.2)
Total Protein: 6 g/dL — ABNORMAL LOW (ref 6.5–8.1)

## 2020-05-21 LAB — HEMOGLOBIN AND HEMATOCRIT, BLOOD
HCT: 27.8 % — ABNORMAL LOW (ref 36.0–46.0)
HCT: 25.4 % — ABNORMAL LOW (ref 36.0–46.0)
Hemoglobin: 9 g/dL — ABNORMAL LOW (ref 12.0–15.0)
Hemoglobin: 8.6 g/dL — ABNORMAL LOW (ref 12.0–15.0)

## 2020-05-21 LAB — URINALYSIS, ROUTINE W REFLEX MICROSCOPIC
Bilirubin Urine: NEGATIVE
Glucose, UA: NEGATIVE mg/dL
Hgb urine dipstick: NEGATIVE
Ketones, ur: NEGATIVE mg/dL
Leukocytes,Ua: NEGATIVE
Nitrite: NEGATIVE
Protein, ur: NEGATIVE mg/dL
Specific Gravity, Urine: 1.024 (ref 1.005–1.030)
pH: 5 (ref 5.0–8.0)

## 2020-05-21 LAB — CBC
HCT: 32.6 % — ABNORMAL LOW (ref 36.0–46.0)
Hemoglobin: 10.6 g/dL — ABNORMAL LOW (ref 12.0–15.0)
MCH: 29.4 pg (ref 26.0–34.0)
MCHC: 32.5 g/dL (ref 30.0–36.0)
MCV: 90.6 fL (ref 80.0–100.0)
Platelets: 228 10*3/uL (ref 150–400)
RBC: 3.6 MIL/uL — ABNORMAL LOW (ref 3.87–5.11)
RDW: 12.1 % (ref 11.5–15.5)
WBC: 10.1 10*3/uL (ref 4.0–10.5)
nRBC: 0 % (ref 0.0–0.2)

## 2020-05-21 LAB — CBC WITH DIFFERENTIAL/PLATELET
Abs Immature Granulocytes: 0.03 10*3/uL (ref 0.00–0.07)
Basophils Absolute: 0 10*3/uL (ref 0.0–0.1)
Basophils Relative: 0 %
Eosinophils Absolute: 0.1 10*3/uL (ref 0.0–0.5)
Eosinophils Relative: 1 %
HCT: 33.5 % — ABNORMAL LOW (ref 36.0–46.0)
Hemoglobin: 10.9 g/dL — ABNORMAL LOW (ref 12.0–15.0)
Immature Granulocytes: 0 %
Lymphocytes Relative: 28 %
Lymphs Abs: 2.3 10*3/uL (ref 0.7–4.0)
MCH: 29.7 pg (ref 26.0–34.0)
MCHC: 32.5 g/dL (ref 30.0–36.0)
MCV: 91.3 fL (ref 80.0–100.0)
Monocytes Absolute: 0.7 10*3/uL (ref 0.1–1.0)
Monocytes Relative: 8 %
Neutro Abs: 5.2 10*3/uL (ref 1.7–7.7)
Neutrophils Relative %: 63 %
Platelets: 245 10*3/uL (ref 150–400)
RBC: 3.67 MIL/uL — ABNORMAL LOW (ref 3.87–5.11)
RDW: 12.3 % (ref 11.5–15.5)
WBC: 8.3 10*3/uL (ref 4.0–10.5)
nRBC: 0 % (ref 0.0–0.2)

## 2020-05-21 LAB — PROTIME-INR
INR: 1.1 (ref 0.8–1.2)
Prothrombin Time: 13.6 seconds (ref 11.4–15.2)

## 2020-05-21 LAB — HIV ANTIBODY (ROUTINE TESTING W REFLEX): HIV Screen 4th Generation wRfx: NONREACTIVE

## 2020-05-21 LAB — SAMPLE TO BLOOD BANK

## 2020-05-21 LAB — SARS CORONAVIRUS 2 (TAT 6-24 HRS): SARS Coronavirus 2: NEGATIVE

## 2020-05-21 SURGERY — SIGMOIDOSCOPY, FLEXIBLE
Anesthesia: Monitor Anesthesia Care

## 2020-05-21 SURGERY — COLONOSCOPY WITH PROPOFOL
Anesthesia: Monitor Anesthesia Care

## 2020-05-21 MED ORDER — PEG-KCL-NACL-NASULF-NA ASC-C 100 G PO SOLR
1.0000 | Freq: Once | ORAL | Status: AC
  Administered 2020-05-21: 200 g via ORAL
  Filled 2020-05-21: qty 1

## 2020-05-21 MED ORDER — PROPOFOL 500 MG/50ML IV EMUL
INTRAVENOUS | Status: DC | PRN
  Administered 2020-05-21: 75 ug/kg/min via INTRAVENOUS

## 2020-05-21 MED ORDER — LIDOCAINE HCL (CARDIAC) PF 100 MG/5ML IV SOSY
PREFILLED_SYRINGE | INTRAVENOUS | Status: DC | PRN
  Administered 2020-05-21: 80 mg via INTRAVENOUS

## 2020-05-21 MED ORDER — PHENYLEPHRINE HCL (PRESSORS) 10 MG/ML IV SOLN
INTRAVENOUS | Status: DC | PRN
  Administered 2020-05-21: 80 ug via INTRAVENOUS

## 2020-05-21 MED ORDER — SODIUM CHLORIDE 0.9 % IV SOLN: INTRAVENOUS | Status: DC

## 2020-05-21 MED ORDER — ONDANSETRON HCL 4 MG/2ML IJ SOLN: 4.0000 mg | Freq: Four times a day (QID) | INTRAMUSCULAR | Status: DC | PRN

## 2020-05-21 MED ORDER — LACTATED RINGERS IV SOLN
INTRAVENOUS | Status: DC
  Administered 2020-05-21: 1000 mL via INTRAVENOUS

## 2020-05-21 MED ORDER — METOCLOPRAMIDE HCL 5 MG/ML IJ SOLN
10.0000 mg | Freq: Once | INTRAMUSCULAR | Status: AC
  Administered 2020-05-21: 10 mg via INTRAVENOUS
  Filled 2020-05-21: qty 2

## 2020-05-21 MED ORDER — PROPOFOL 10 MG/ML IV BOLUS
INTRAVENOUS | Status: DC | PRN
  Administered 2020-05-21: 40 mg via INTRAVENOUS
  Administered 2020-05-21: 20 mg via INTRAVENOUS
  Administered 2020-05-21: 30 mg via INTRAVENOUS
  Administered 2020-05-21: 40 mg via INTRAVENOUS
  Administered 2020-05-21 (×2): 30 mg via INTRAVENOUS
  Administered 2020-05-21: 40 mg via INTRAVENOUS
  Administered 2020-05-21: 30 mg via INTRAVENOUS

## 2020-05-21 MED ORDER — ONDANSETRON HCL 4 MG PO TABS: 4.0000 mg | ORAL_TABLET | Freq: Four times a day (QID) | ORAL | Status: DC | PRN

## 2020-05-21 SURGICAL SUPPLY — 22 items

## 2020-05-21 NOTE — Telephone Encounter (Signed)
Got it, thank you!!

## 2020-05-21 NOTE — Op Note (Addendum)
[facility] Patient Name: Michele Ali Procedure Date : 05/21/2020 MRN: [id number] Attending MD: Mauri Pole , MD Date of Birth: 1971-03-21 CSN: [id number] Age: 49 Admit Type: Inpatient Procedure:                Colonoscopy Indications:              Treatment of bleeding from polypectomy site Providers:                Mauri Pole, MD, Glori Bickers, RN, Cletis Athens, Technician, Raphael Gibney, CRNA Referring MD:              Medicines:                Monitored Anesthesia Care Complications:            No immediate complications. Estimated Blood Loss:     Estimated blood loss: none. Procedure:                Pre-Anesthesia Assessment:                           - Prior to the procedure, a History and Physical                            was performed, and patient medications and                            allergies were reviewed. The patient's tolerance of                            previous anesthesia was also reviewed. The risks                            and benefits of the procedure and the sedation                            options and risks were discussed with the patient.                            All questions were answered, and informed consent                            was obtained. Prior Anticoagulants: The patient has                            taken no previous anticoagulant or antiplatelet                            agents. ASA Grade Assessment: III - A patient with                            severe systemic disease. After reviewing the risks  and benefits, the patient was deemed in                            satisfactory condition to undergo the procedure.                           After obtaining informed consent, the colonoscope                            was passed under direct vision. Throughout the                            procedure, the patient's blood pressure, pulse, and                     oxygen saturations were monitored continuously. The                            PCF-H190DL ([id number]) Olympus pediatric colonoscope                            was introduced through the anus and advanced to the                            the cecum, identified by appendiceal orifice and                            ileocecal valve. The colonoscopy was performed                            without difficulty. The patient tolerated the                            procedure well. Scope In: 4:22:22 PM Scope Out: 4:48:16 PM Scope Withdrawal Time: 0 hours 18 minutes 40 seconds  Total Procedure Duration: 0 hours 25 minutes 54 seconds  Findings:      The perianal and digital rectal examinations were normal.      A 18 mm post polypectomy scar was found in the ascending colon with       clean base, no heme or visible vessel. To prevent bleeding after mucosal       resection, four hemostatic clips were successfully placed (MR       conditional) to close the defect. There was no bleeding at the end of       the procedure.      A 5 mm post polypectomy scar was found in the sigmoid colon with visible       vessel/pigment spot. To prevent bleeding post-intervention, two       hemostatic clips were successfully placed (MR conditional). There was no       bleeding at the end of the procedure. Impression:               - Post-polypectomy scar in the ascending colon.                            Clips (MR conditional) were placed to close the  defect.                           - Post-polypectomy scar in the sigmoid colon with                            pigment spot and visible vessel likely site of                            bleed. Clips (MR conditional) were placed to close                            the defect and prevent recurrent bleeding.                           - No specimens collected. Recommendation:           - Patient has a contact number available for                             emergencies. The signs and symptoms of potential                            delayed complications were discussed with the                            patient. Return to normal activities tomorrow.                            Written discharge instructions were provided to the                            patient.                           - Resume previous diet.                           - Continue present medications.                           - Ok to discharge home this evening                           - Recheck CBC next week                           - Oral ferrous gluconate 1 tablet twice daily with                            meals X3 months Procedure Code(s):        --- Professional ---                           [phone], Colonoscopy, flexible; diagnostic, including  collection of specimen(s) by brushing or washing,                            when performed (separate procedure) Diagnosis Code(s):        --- Professional ---                           [phone], Other specified postprocedural states                           K91.840, Postprocedural hemorrhage of a digestive                            system organ or structure following a digestive                            system procedure CPT copyright 2019 [employer]. All rights reserved. The codes documented in this report are preliminary and upon coder review may  be revised to meet current compliance requirements. Mauri Pole, MD 05/21/2020 5:14:36 PM This report has been signed electronically. Number of Addenda: 0

## 2020-05-21 NOTE — Anesthesia Preprocedure Evaluation (Addendum)
Anesthesia Evaluation  Patient identified by MRN, date of birth, ID band Patient awake    Reviewed: Allergy & Precautions, NPO status , Patient's Chart, lab work & pertinent test results  History of Anesthesia Complications Negative for: history of anesthetic complications  Airway Mallampati: I  TM Distance: >3 FB Neck ROM: Full    Dental  (+) Dental Advisory Given, Teeth Intact   Pulmonary asthma ,    Pulmonary exam normal        Cardiovascular negative cardio ROS Normal cardiovascular exam     Neuro/Psych  Headaches, Seizures - (none since childhood), Well Controlled,  PSYCHIATRIC DISORDERS Anxiety    GI/Hepatic Neg liver ROS, GERD  Medicated and Controlled,  Endo/Other  negative endocrine ROS  Renal/GU negative Renal ROS     Musculoskeletal negative musculoskeletal ROS (+)   Abdominal   Peds  Hematology  (+) Blood dyscrasia, anemia ,  Protein S deficiency    Anesthesia Other Findings Covid neg 6/1  Reproductive/Obstetrics                            Anesthesia Physical Anesthesia Plan  ASA: III  Anesthesia Plan: MAC   Post-op Pain Management:    Induction: Intravenous  PONV Risk Score and Plan: 2 and Propofol infusion and Treatment may vary due to age or medical condition  Airway Management Planned: Nasal Cannula and Natural Airway  Additional Equipment: None  Intra-op Plan:   Post-operative Plan:   Informed Consent: I have reviewed the patients History and Physical, chart, labs and discussed the procedure including the risks, benefits and alternatives for the proposed anesthesia with the patient or authorized representative who has indicated his/her understanding and acceptance.       Plan Discussed with: CRNA and Anesthesiologist  Anesthesia Plan Comments:        Anesthesia Quick Evaluation

## 2020-05-21 NOTE — Interval H&P Note (Signed)
History and Physical Interval Note:  05/21/2020 4:19 PM  Michele Ali  has presented today for surgery, with the diagnosis of post polypectomy bleed.  The various methods of treatment have been discussed with the patient and family. After consideration of risks, benefits and other options for treatment, the patient has consented to  Procedure(s): COLONOSCOPY WITH PROPOFOL (N/A) as a surgical intervention.  The patient's history has been reviewed, patient examined, no change in status, stable for surgery.  I have reviewed the patient's chart and labs.  Questions were answered to the patient's satisfaction.     [name]

## 2020-05-21 NOTE — Telephone Encounter (Signed)
Thank you for taking care of her. GM

## 2020-05-21 NOTE — ED Provider Notes (Signed)
[facility] EMERGENCY DEPARTMENT Provider Note   CSN: [id number] Arrival date & time: 05/21/20  0556     History Chief Complaint  Patient presents with  . Rectal Bleeding    Colonoscopy yesterday    Parminder Trapani is a 49 y.o. female.  The history is provided by the patient, the spouse and medical records. No language interpreter was used.  Rectal Bleeding Quality:  Bright red Amount:  Copious Duration:  1 day Timing:  Constant Chronicity:  New Context comment:  Colonoscopy yesterday Relieved by:  Nothing Worsened by:  Nothing Ineffective treatments:  None tried Associated symptoms: light-headedness   Associated symptoms: no abdominal pain, no dizziness, no fever, no hematemesis, no loss of consciousness, no recent illness and no vomiting   Risk factors: hx of colorectal surgery (colonoscopy yesterday with polyp removal)        Past Medical History:  Diagnosis Date  . Allergy   . Anemia   . Anxiety   . Asthma   . 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 GERD (gastroesophageal reflux disease)   . Migraine   . Seizures ([place])    childhood ages [age] on medication but none since then  . Skin cancer    basal    Patient Active Problem List   Diagnosis Date Noted  . Lower abdominal pain 01/30/2020  . GERD (gastroesophageal reflux disease) 01/30/2020    Past Surgical History:  Procedure Laterality Date  . ANKLE SURGERY Right   . BASAL CELL CARCINOMA EXCISION     2016  left flank,  . CERVICAL SPINE SURGERY  06/2017   C5-C7  . ENDOMETRIAL ABLATION    . EXPLORATORY LAPAROTOMY    . NECK SURGERY     "lumpectomy"  . TUBAL LIGATION       OB History   No obstetric history on file.     Family History  Problem Relation Age of Onset  . Crohn's disease Father   . Dementia Father   . Atrial fibrillation Father   . Ulcerative colitis Father   . Heart disease Mother   . Diverticulitis Mother   . Cancer Maternal Grandmother   .  Cancer Paternal Grandmother   . Pancreatic disease Paternal Grandfather   . Colon cancer Other   . Esophageal cancer Neg Hx   . Rectal cancer Neg Hx   . Stomach cancer Neg Hx     Social History   Tobacco Use  . Smoking status: Never Smoker  . Smokeless tobacco: Never Used  Substance Use Topics  . Alcohol use: Yes    Comment: rarely  . Drug use: No    Home Medications Prior to Admission medications   Medication Sig Start Date End Date Taking? Authorizing Provider  ALBUTEROL SULFATE HFA IN Inhale into the lungs as needed.    [provider]  Butalbital-Acetaminophen (BUPAP) 50-300 MG TABS Bupap 50 mg-300 mg tablet 06/17/16   [provider]  Cholecalciferol (VITAMIN D-1000 MAX ST) 1000 units tablet Take 1,000 Units by mouth daily.     [provider]  clonazePAM (KLONOPIN) 0.5 MG tablet Take 0.5 mg by mouth 3 (three) times daily.    [provider]  fluticasone (FLONASE) 50 MCG/ACT nasal spray Place into the nose.    [provider]  loratadine (CLARITIN) 10 MG tablet Take 10 mg by mouth daily.    [provider]  meloxicam (MOBIC) 15 MG tablet takes daily 01/07/18  [provider]  methocarbamol (ROBAXIN) 750 MG tablet Take 750 mg by mouth as needed.     [provider]  Multiple Vitamin (MULTI-VITAMINS) TABS Take by mouth daily.     [provider]  omeprazole (PRILOSEC) 40 MG capsule Take 1 capsule (40 mg total) by mouth daily. 01/30/20   Noralyn Pick, NP  sertraline (ZOLOFT) 100 MG tablet Take 100 mg by mouth daily.    [provider]    Allergies    Latex, Penicillins, and Sulfa antibiotics  Review of Systems   Review of Systems  Constitutional: Positive for fatigue. Negative for chills, diaphoresis and fever.  HENT: Negative for congestion.   Eyes: Negative for visual disturbance.  Respiratory: Negative for cough, chest tightness, shortness of breath and wheezing.     Cardiovascular: Negative for chest pain, palpitations and leg swelling.  Gastrointestinal: Positive for anal bleeding, blood in stool and hematochezia. Negative for abdominal pain, constipation, diarrhea, hematemesis, nausea, rectal pain and vomiting.  Genitourinary: Negative for dysuria.  Musculoskeletal: Negative for back pain, neck pain and neck stiffness.  Neurological: Positive for light-headedness. Negative for dizziness, loss of consciousness, syncope, weakness and headaches.  Psychiatric/Behavioral: Negative for agitation and confusion.  All other systems reviewed and are negative.   Physical Exam Updated Vital Signs BP 101/72 (BP Location: Right Arm)   Pulse 95   Temp 98.6 F (37 C) (Oral)   Resp 16   Ht _0  (1.575 m)   Wt 55 kg   SpO2 100%   BMI 22.18 kg/m   Physical Exam Vitals and nursing note reviewed.  Constitutional:      General: She is not in acute distress.    Appearance: She is well-developed. She is not ill-appearing, toxic-appearing or diaphoretic.  HENT:     Head: Normocephalic and atraumatic.     Mouth/Throat:     Mouth: Mucous membranes are dry.     Pharynx: No oropharyngeal exudate or posterior oropharyngeal erythema.  Eyes:     Extraocular Movements: Extraocular movements intact.     Conjunctiva/sclera: Conjunctivae normal.     Pupils: Pupils are equal, round, and reactive to light.  Cardiovascular:     Rate and Rhythm: Normal rate and regular rhythm.     Pulses: Normal pulses.     Heart sounds: No murmur.  Pulmonary:     Effort: Pulmonary effort is normal. No respiratory distress.     Breath sounds: Normal breath sounds. No wheezing, rhonchi or rales.  Chest:     Chest wall: No tenderness.  Abdominal:     General: Abdomen is flat.     Palpations: Abdomen is soft.     Tenderness: There is no abdominal tenderness. There is no right CVA tenderness, left CVA tenderness, guarding or rebound.  Genitourinary:    Comments: Deferred to GI team  coming to see her now. Known rectal bleeding present Musculoskeletal:        General: No tenderness.     Cervical back: Neck supple. No tenderness.  Skin:    General: Skin is warm and dry.     Capillary Refill: Capillary refill takes less than 2 seconds.     Coloration: Skin is pale.     Findings: No rash.  Neurological:     General: No focal deficit present.     Mental Status: She is alert.  Psychiatric:        Mood and Affect: Mood normal.     ED Results / Procedures /  Treatments   Labs (all labs ordered are listed, but only abnormal results are displayed) Labs Reviewed  CBC WITH DIFFERENTIAL/PLATELET - Abnormal; Notable for the following components:      Result Value   RBC 3.67 (*)    Hemoglobin 10.9 (*)    HCT 33.5 (*)    All other components within normal limits  COMPREHENSIVE METABOLIC PANEL - Abnormal; Notable for the following components:   Glucose, Bld 108 (*)    Calcium 8.2 (*)    Total Protein 6.0 (*)    Alkaline Phosphatase 28 (*)    All other components within normal limits  URINALYSIS, ROUTINE W REFLEX MICROSCOPIC - Abnormal; Notable for the following components:   APPearance HAZY (*)    All other components within normal limits  CBC - Abnormal; Notable for the following components:   RBC 3.60 (*)    Hemoglobin 10.6 (*)    HCT 32.6 (*)    All other components within normal limits  SARS CORONAVIRUS 2 (TAT 6-24 HRS)  PROTIME-INR  HEMOGLOBIN AND HEMATOCRIT, BLOOD  HEMOGLOBIN AND HEMATOCRIT, BLOOD  SAMPLE TO BLOOD BANK    EKG EKG Interpretation  Date/Time:  Friday May 21 2020 08:09:03 EDT Ventricular Rate:  84 PR Interval:    QRS Duration: 81 QT Interval:  358 QTC Calculation: 424 R Axis:   69 Text Interpretation: Sinus rhythm Right atrial enlargement No prior ECG for comparison. No STEMI Confirmed by Antony Blackbird [phone]) on 05/21/2020 9:02:19 AM   Radiology No results found.  Procedures Procedures (including critical care  time)  Medications Ordered in ED Medications  0.9 %  sodium chloride infusion (has no administration in time range)  peg 3350 powder (MOVIPREP) kit 200 g (has no administration in time range)  metoCLOPramide (REGLAN) injection 10 mg (has no administration in time range)  0.9 %  sodium chloride infusion (has no administration in time range)    ED Course  I have reviewed the triage vital signs and the nursing notes.  Pertinent labs & imaging results that were available during my care of the patient were reviewed by me and considered in my medical decision making (see chart for details).    MDM Rules/Calculators/A&P                      Nitasha Jewel is a 49 y.o. female with a past medical history significant for migraines, GERD, prior clotting disorder, seizures, and anxiety who had colonoscopy yesterday with multiple polyp removals who was told to come back and by her lower GI team this morning for rectal bleeding.  Patient reports that overnight, patient developed rectal bleeding with significant bright red bleeding.  She reports that she has had greater than 10 bowel movements that is all for blood.  She reports she is now feeling pale, lightheaded, and near syncopal.  She reports has not actually passed out however.  She reports feeling fatigued but denies current chest pain or shortness of breath.  She denies any abdominal pain with it.  She reports no urinary symptoms, nausea, or vomiting.  No other trauma.  She called her GI team who told her to come in and she would likely need repeat endoscopy this morning.  On exam, abdomen is nontender and bowel sounds were appreciated.  Patient is somewhat pale appearing on mucous membranes.  Patient's lungs were clear and chest was nontender.  No murmur.  Blood pressure was around 782 systolic on my evaluation.  The phone  note from earlier shows that the of our GI team wanted the patient to be reimaged this morning and may indeed want her to be  admitted to medicine.  We will call the low our GI team to see what they would like to do but will repeat blood work.  Patient's initial hemoglobin 2 hours ago showed her hemoglobin had dropped   to 10.9 however she reports she is continued to have rectal bleeding so we will follow that again.  Her metabolic panel otherwise showed no elevation in creatinine or liver function.  Urinalysis does not show infection.  She reports she had a negative Covid test several days ago before her colonoscopy.  INR is normal at 1.1.  We will talk to GI and discuss if they want her to be admitted.  9:40 AM Just spoke with low-power GI who came to see the patient.  They will place orders for her to have a colonoscopy this afternoon and does feel she needs to be admitted for further monitoring.  They recommend observation mission to hospitalist service and they will put another orders.  Patient agrees this plan and we will call hospitalist team for admission.  Still waiting on repeat CBC to make sure she does not need blood transfusion before her procedure.   Final Clinical Impression(s) / ED Diagnoses Final diagnoses:  Rectal bleeding  Fatigue, unspecified type  Lightheadedness     Clinical Impression: 1. Rectal bleeding   2. Fatigue, unspecified type   3. Lightheadedness     Disposition: Admit  This note was prepared with assistance of Systems analyst. Occasional wrong-word or sound-a-like substitutions may have occurred due to the inherent limitations of voice recognition software.     Leemon Ayala, Gwenyth Allegra, MD 05/21/20 1006

## 2020-05-21 NOTE — ED Triage Notes (Addendum)
Patient reports increasing rectal bleeding onset last night , she had a colonoscopy yesterday afternoon at [facility] gastroenterology clinic.

## 2020-05-21 NOTE — ED Notes (Signed)
Pt in NAD, husband at bedside.  GI in room for consult.  Will return to to start IV per MD.

## 2020-05-21 NOTE — Progress Notes (Signed)
Patient arrived to room 5C08 in NAD, VS stable and free from pain. Husband at bedside. Patient oriented to room. Patient says she is scheduled for colonoscopy at 4pm and was told she only had to drink 1 bottle of the prep.

## 2020-05-21 NOTE — Consult Note (Addendum)
[facility] Consult: 8:21 AM 05/21/2020  LOS: 0 days    Referring Provider:  Dr Sherry Ruffing in ED Primary Care Physician:  Imagene Riches, NP Primary Gastroenterologist:  Dr Rush Landmark Pt cell phone [phone].     Reason for Consultation:  Post-polypectomy bleeding   HPI: Airen Dales is a 49 y.o. female.  Hx depression/anxiety. Fibromyalgia. Migraines. GERD as an infant. Anemia, details unclear. Degenerative disc disease.  S/p tubal ligation, C-spine fusion, exploratory laparotomy, right ankle surgery.  Evaluation for lower abdominal pain in February. Treated by urgent care  with Cipro/Flagyl for possible diverticulitis. Further evaluation revealed R fundal endometrial and L ovarian cysts on CTAP 02/12/20. Referred to GYN. Hgb 11.4, MCV 87.  LFTs normal.  IgA, TTG normal.   Eventually underwent endoscopic evaluation yesterday.   05/20/20 EGD: island salmon-colored mucosa, biopsied for suspicion Barrett 05/20/20 Colonoscopy: screening study.  Lift/cold-snare removal 1.5 cm prox ascending polyp.  Mucosal resection1.3 cm rectosigmoid polyp.  Non-bleeding int rrhoids.    Passed scant blood at endo center before discharge home.  Large volume red blood starting 6:30 PM and frequent episodes after, through 8 AM today.  4 episodes alone in ED, at least 12 so far.  Lower left abd tenderness, no signif pain though.  Brief dizziness, brief tachycardia.  No n/v.  Advised to come to ED, arrival ~ 6 AM.  Takes Meloxicam 7.5 mg bid, last dose was last night.  No other asa products.    BP 101/72.  HR 95.   Hgb 10.9.  MCV 91.  Family history of Crohn's in her father, diverticulitis in mother. Colon cancer in paternal uncle and cousins. Pancreatic cancer in paternal grandfather. Breast cancer in both grandmothers.    Past Medical  History:  Diagnosis Date  . Allergy   . Anemia   . Anxiety   . Asthma   . 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 GERD (gastroesophageal reflux disease)   . Migraine   . Seizures ([place])    childhood ages [age] on medication but none since then  . Skin cancer    basal    Past Surgical History:  Procedure Laterality Date  . ANKLE SURGERY Right   . BASAL CELL CARCINOMA EXCISION     2016  left flank,  . CERVICAL SPINE SURGERY  06/2017   C5-C7  . ENDOMETRIAL ABLATION    . EXPLORATORY LAPAROTOMY    . NECK SURGERY     "lumpectomy"  . TUBAL LIGATION      Prior to Admission medications   Medication Sig Start Date End Date Taking? Authorizing Provider  ALBUTEROL SULFATE HFA IN Inhale into the lungs as needed.    [provider]  Butalbital-Acetaminophen (BUPAP) 50-300 MG TABS Bupap 50 mg-300 mg tablet 06/17/16   [provider]  Cholecalciferol (VITAMIN D-1000 MAX ST) 1000 units tablet Take 1,000 Units by mouth daily.     [provider]  clonazePAM (KLONOPIN) 0.5 MG tablet Take 0.5 mg by mouth 3 (three) times daily.  [provider]  fluticasone (FLONASE) 50 MCG/ACT nasal spray Place into the nose.    [provider]  loratadine (CLARITIN) 10 MG tablet Take 10 mg by mouth daily.    [provider]  meloxicam (MOBIC) 15 MG tablet takes daily 01/07/18   [provider]  methocarbamol (ROBAXIN) 750 MG tablet Take 750 mg by mouth as needed.     [provider]  Multiple Vitamin (MULTI-VITAMINS) TABS Take by mouth daily.     [provider]  omeprazole (PRILOSEC) 40 MG capsule Take 1 capsule (40 mg total) by mouth daily. 01/30/20   Noralyn Pick, NP  sertraline (ZOLOFT) 100 MG tablet Take 100 mg by mouth daily.    [provider]    Scheduled Meds:  Infusions:  PRN Meds:    Allergies as of 05/21/2020 - Review Complete 05/20/2020  Allergen Reaction Noted  . Latex   02/21/2018  . Penicillins  02/21/2018  . Sulfa antibiotics  02/21/2018    Family History  Problem Relation Age of Onset  . Crohn's disease Father   . Dementia Father   . Atrial fibrillation Father   . Ulcerative colitis Father   . Heart disease Mother   . Diverticulitis Mother   . Cancer Maternal Grandmother   . Cancer Paternal Grandmother   . Pancreatic disease Paternal Grandfather   . Colon cancer Other   . Esophageal cancer Neg Hx   . Rectal cancer Neg Hx   . Stomach cancer Neg Hx     Social History   Socioeconomic History  . Marital status: Married    Spouse name: Not on file  . Number of children: Not on file  . Years of education: Not on file  . Highest education level: Not on file  Occupational History  . Not on file  Tobacco Use  . Smoking status: Never Smoker  . Smokeless tobacco: Never Used  Substance and Sexual Activity  . Alcohol use: Yes    Comment: rarely  . Drug use: No  . Sexual activity: Not on file  Other Topics Concern  . Not on file  Social History Narrative   Lives at home, with husband and 2 kids and pets.  Works at [employer].  Jenny Reichmann is spouse.  Caffeine 1 cup coffee daily.   Social Determinants of Health   Financial Resource Strain:   . Difficulty of Paying Living Expenses:   Food Insecurity:   . Worried About Charity fundraiser in the Last Year:   . Arboriculturist in the Last Year:   Transportation Needs:   . Film/video editor (Medical):   Marland Kitchen Lack of Transportation (Non-Medical):   Physical Activity:   . Days of Exercise per Week:   . Minutes of Exercise per Session:   Stress:   . Feeling of Stress :   Social Connections:   . Frequency of Communication with Friends and Family:   . Frequency of Social Gatherings with Friends and Family:   . Attends Religious Services:   . Active Member of Clubs or Organizations:   . Attends Archivist Meetings:   Marland Kitchen Marital Status:   Intimate Partner Violence:   . Fear  of Current or Ex-Partner:   . Emotionally Abused:   Marland Kitchen Physically Abused:   . Sexually Abused:     REVIEW OF SYSTEMS: Constitutional:  See HPI.  No weakness, fatigue ENT:  No nose bleeds.  Pulm:  No SOB  CV:  No palpitations, no LE edema.  GU:  No hematuria, no frequency GYN: No recent menses, she is perimenopausal. GI:  See HPI Heme:  Other than recent events, no unusual bleeding or bruising   Transfusions: None Neuro:  No headaches, no peripheral tingling or numbness Derm:  No itching, no rash or sores.  Endocrine:  No sweats or chills.  No polyuria or dysuria Immunization: Not queried.    PHYSICAL EXAM: Vital signs in last 24 hours: Vitals:   05/21/20 0603  BP: 101/72  Pulse: 95  Resp: 16  Temp: 98.6 F (37 C)  SpO2: 100%   Wt Readings from Last 3 Encounters:  05/21/20 55 kg  05/20/20 51.7 kg  01/30/20 51.8 kg    General: Patient looks well, comfortable.  Not diaphoretic or pale. Head: No facial asymmetry or swelling.  No signs of head trauma. Eyes: No scleral icterus.  No conjunctival pallor. Ears: Not hard of hearing Nose: No congestion or discharge. Mouth: Oral mucosa is moist, pink, clear.  Tongue midline Neck: No JVD, no masses, no thyromegaly. Lungs: Clear bilaterally.  No labored breathing.  No cough Heart: RRR.  No MRG.  S1, S2 present. Abdomen: Soft.  Hypoactive bowel sounds.  Nondistended.  Tenderness without guarding or rebound in the left lower quadrant.  No HSM, masses, bruits, hernias..   Rectal: Deferred Musc/Skeltl: No joint redness, swelling or gross deformity. Extremities: No CCE. Neurologic: Alert.  Oriented x3.  Good historian.  Moves all 4 limbs without gross weakness but strength not formally tested. Skin: No rash, no sores or suspicious lesions. Nodes: No cervical adenopathy. Psych: Cooperative, pleasant, calm  Intake/Output from previous day: No intake/output data recorded. Intake/Output this shift: No intake/output data  recorded.  LAB RESULTS: Recent Labs    05/21/20 0615  WBC 8.3  HGB 10.9*  HCT 33.5*  PLT 245   BMET Lab Results  Component Value Date   NA 138 05/21/2020   NA 133 (L) 02/09/2020   NA 137 01/30/2020   K 3.7 05/21/2020   K 4.4 02/09/2020   K 4.0 01/30/2020   CL 106 05/21/2020   CL 97 02/09/2020   CL 102 01/30/2020   CO2 24 05/21/2020   CO2 29 02/09/2020   CO2 29 01/30/2020   GLUCOSE 108 (H) 05/21/2020   GLUCOSE 88 02/09/2020   GLUCOSE 98 01/30/2020   BUN 12 05/21/2020   BUN 15 02/09/2020   BUN 18 01/30/2020   CREATININE 0.78 05/21/2020   CREATININE 0.71 02/09/2020   CREATININE 0.73 01/30/2020   CALCIUM 8.2 (L) 05/21/2020   CALCIUM 9.0 02/09/2020   CALCIUM 8.5 01/30/2020   LFT Recent Labs    05/21/20 0615  PROT 6.0*  ALBUMIN 3.5  AST 16  ALT 16  ALKPHOS 28*  BILITOT 0.9   PT/INR No results found for: INR, PROTIME Hepatitis Panel  RADIOLOGY STUDIES: No results found.    IMPRESSION:   *  Post-polypectomy bleed.   Resection of 2 polyps > 1cm yesterday  *   Normocytic anemia w element of ABL.    *    IBS.     *   GERD.  Salmon colored mucasal island on Esophagus at yesterday's EGD, biopsied for suspicion Barretts.     PLAN:     *   Repeat colonoscopy, ~ 5 PM tonight.    *   q 6 H & H x 2, CBC in AM   Azucena Freed  05/21/2020, 8:21 AM Phone  [phone]  ________________________________________________________________________  Velora Heckler GI MD note:  I personally examined the patient, reviewed the data and agree with the assessment and plan described above.  Post polypectomy bleeding, either from the very proximal colon or the rectosigmoid site.  Currently she feels the bleeding is slowing down (as of 11am).  We are getting her ready for repeat procedure today, prepping as a full colonoscopy just in case the culprit is indeed in the right colon.  Owens Loffler, MD [facility] Pager [phone]

## 2020-05-21 NOTE — Telephone Encounter (Signed)
Patient underwent colonoscopy yesterday. 1 15 mm polyp in [facility] that was cold-snare resected en-bloc. 1 13 mm polyp in R/S saline lift cautery resected en bloc. Procedure not complex other than positioning for the R/S polyp  Patient states that since 1100 PM she has had 3 Bloody BMs and feels things pooling up once again. No dizziness or lightheadedness but she is concerned about becoming anemic. She needs to go to the ED for further evaluation and likely repeat endoscopic evaluation later today. She preferentially wants to go to [facility] hospital. She was told to back an observation bag for potential observation overnight stay. I recommend ED evaluation, blood counts, electrolytes, and COVID testing (even if vaccinated as it is hospital protocol). She shoulld be coordinated for a potential admission with the hospitalist service, should she need to be admitted for observation. Will forward this to our inpatient GI team, but I think starting with a flexible sigmodoscopy to evaluate the polyp resection site in the R/S is probably the first thing to look at it, and if that is not the source of bleeding then switching to the colonoscope to get to the ascending colon is reasonable at that point.. She has not eaten anything more than a few eggs since the procedure yesterday PM. May require a tap water enema prior to attempt at procedure hopefully later today. Patient starting to get ready to leave for hospital. I am calling [facility] ED Charge RN now.   Justice Britain, MD [facility] Advanced Endoscopy Office # [phone]

## 2020-05-21 NOTE — H&P (View-Only) (Signed)
[facility] Consult: 8:21 AM 05/21/2020  LOS: 0 days    Referring Provider:  Dr Sherry Ruffing in ED Primary Care Physician:  Imagene Riches, NP Primary Gastroenterologist:  Dr Rush Landmark Pt cell phone [phone].     Reason for Consultation:  Post-polypectomy bleeding   HPI: Michele Ali is a 49 y.o. female.  Hx depression/anxiety. Fibromyalgia. Migraines. GERD as an infant. Anemia, details unclear. Degenerative disc disease.  S/p tubal ligation, C-spine fusion, exploratory laparotomy, right ankle surgery.  Evaluation for lower abdominal pain in February. Treated by urgent care  with Cipro/Flagyl for possible diverticulitis. Further evaluation revealed R fundal endometrial and L ovarian cysts on CTAP 02/12/20. Referred to GYN. Hgb 11.4, MCV 87.  LFTs normal.  IgA, TTG normal.   Eventually underwent endoscopic evaluation yesterday.   05/20/20 EGD: island salmon-colored mucosa, biopsied for suspicion Barrett 05/20/20 Colonoscopy: screening study.  Lift/cold-snare removal 1.5 cm prox ascending polyp.  Mucosal resection1.3 cm rectosigmoid polyp.  Non-bleeding int rrhoids.    Passed scant blood at endo center before discharge home.  Large volume red blood starting 6:30 PM and frequent episodes after, through 8 AM today.  4 episodes alone in ED, at least 12 so far.  Lower left abd tenderness, no signif pain though.  Brief dizziness, brief tachycardia.  No n/v.  Advised to come to ED, arrival ~ 6 AM.  Takes Meloxicam 7.5 mg bid, last dose was last night.  No other asa products.    BP 101/72.  HR 95.   Hgb 10.9.  MCV 91.  Family history of Crohn's in her father, diverticulitis in mother. Colon cancer in paternal uncle and cousins. Pancreatic cancer in paternal grandfather. Breast cancer in both grandmothers.    Past Medical  History:  Diagnosis Date  . Allergy   . Anemia   . Anxiety   . Asthma   . 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 GERD (gastroesophageal reflux disease)   . Migraine   . Seizures ([place])    childhood ages [age] on medication but none since then  . Skin cancer    basal    Past Surgical History:  Procedure Laterality Date  . ANKLE SURGERY Right   . BASAL CELL CARCINOMA EXCISION     2016  left flank,  . CERVICAL SPINE SURGERY  06/2017   C5-C7  . ENDOMETRIAL ABLATION    . EXPLORATORY LAPAROTOMY    . NECK SURGERY     "lumpectomy"  . TUBAL LIGATION      Prior to Admission medications   Medication Sig Start Date End Date Taking? Authorizing Provider  ALBUTEROL SULFATE HFA IN Inhale into the lungs as needed.    [provider]  Butalbital-Acetaminophen (BUPAP) 50-300 MG TABS Bupap 50 mg-300 mg tablet 06/17/16   [provider]  Cholecalciferol (VITAMIN D-1000 MAX ST) 1000 units tablet Take 1,000 Units by mouth daily.     [provider]  clonazePAM (KLONOPIN) 0.5 MG tablet Take 0.5 mg by mouth 3 (three) times daily.  [provider]  fluticasone (FLONASE) 50 MCG/ACT nasal spray Place into the nose.    [provider]  loratadine (CLARITIN) 10 MG tablet Take 10 mg by mouth daily.    [provider]  meloxicam (MOBIC) 15 MG tablet takes daily 01/07/18   [provider]  methocarbamol (ROBAXIN) 750 MG tablet Take 750 mg by mouth as needed.     [provider]  Multiple Vitamin (MULTI-VITAMINS) TABS Take by mouth daily.     [provider]  omeprazole (PRILOSEC) 40 MG capsule Take 1 capsule (40 mg total) by mouth daily. 01/30/20   Noralyn Pick, NP  sertraline (ZOLOFT) 100 MG tablet Take 100 mg by mouth daily.    [provider]    Scheduled Meds:  Infusions:  PRN Meds:    Allergies as of 05/21/2020 - Review Complete 05/20/2020  Allergen Reaction Noted  . Latex   02/21/2018  . Penicillins  02/21/2018  . Sulfa antibiotics  02/21/2018    Family History  Problem Relation Age of Onset  . Crohn's disease Father   . Dementia Father   . Atrial fibrillation Father   . Ulcerative colitis Father   . Heart disease Mother   . Diverticulitis Mother   . Cancer Maternal Grandmother   . Cancer Paternal Grandmother   . Pancreatic disease Paternal Grandfather   . Colon cancer Other   . Esophageal cancer Neg Hx   . Rectal cancer Neg Hx   . Stomach cancer Neg Hx     Social History   Socioeconomic History  . Marital status: Married    Spouse name: Not on file  . Number of children: Not on file  . Years of education: Not on file  . Highest education level: Not on file  Occupational History  . Not on file  Tobacco Use  . Smoking status: Never Smoker  . Smokeless tobacco: Never Used  Substance and Sexual Activity  . Alcohol use: Yes    Comment: rarely  . Drug use: No  . Sexual activity: Not on file  Other Topics Concern  . Not on file  Social History Narrative   Lives at home, with husband and 2 kids and pets.  Works at [employer].  Jenny Reichmann is spouse.  Caffeine 1 cup coffee daily.   Social Determinants of Health   Financial Resource Strain:   . Difficulty of Paying Living Expenses:   Food Insecurity:   . Worried About Charity fundraiser in the Last Year:   . Arboriculturist in the Last Year:   Transportation Needs:   . Film/video editor (Medical):   Marland Kitchen Lack of Transportation (Non-Medical):   Physical Activity:   . Days of Exercise per Week:   . Minutes of Exercise per Session:   Stress:   . Feeling of Stress :   Social Connections:   . Frequency of Communication with Friends and Family:   . Frequency of Social Gatherings with Friends and Family:   . Attends Religious Services:   . Active Member of Clubs or Organizations:   . Attends Archivist Meetings:   Marland Kitchen Marital Status:   Intimate Partner Violence:   . Fear  of Current or Ex-Partner:   . Emotionally Abused:   Marland Kitchen Physically Abused:   . Sexually Abused:     REVIEW OF SYSTEMS: Constitutional:  See HPI.  No weakness, fatigue ENT:  No nose bleeds.  Pulm:  No SOB  CV:  No palpitations, no LE edema.  GU:  No hematuria, no frequency GYN: No recent menses, she is perimenopausal. GI:  See HPI Heme:  Other than recent events, no unusual bleeding or bruising   Transfusions: None Neuro:  No headaches, no peripheral tingling or numbness Derm:  No itching, no rash or sores.  Endocrine:  No sweats or chills.  No polyuria or dysuria Immunization: Not queried.    PHYSICAL EXAM: Vital signs in last 24 hours: Vitals:   05/21/20 0603  BP: 101/72  Pulse: 95  Resp: 16  Temp: 98.6 F (37 C)  SpO2: 100%   Wt Readings from Last 3 Encounters:  05/21/20 55 kg  05/20/20 51.7 kg  01/30/20 51.8 kg    General: Patient looks well, comfortable.  Not diaphoretic or pale. Head: No facial asymmetry or swelling.  No signs of head trauma. Eyes: No scleral icterus.  No conjunctival pallor. Ears: Not hard of hearing Nose: No congestion or discharge. Mouth: Oral mucosa is moist, pink, clear.  Tongue midline Neck: No JVD, no masses, no thyromegaly. Lungs: Clear bilaterally.  No labored breathing.  No cough Heart: RRR.  No MRG.  S1, S2 present. Abdomen: Soft.  Hypoactive bowel sounds.  Nondistended.  Tenderness without guarding or rebound in the left lower quadrant.  No HSM, masses, bruits, hernias..   Rectal: Deferred Musc/Skeltl: No joint redness, swelling or gross deformity. Extremities: No CCE. Neurologic: Alert.  Oriented x3.  Good historian.  Moves all 4 limbs without gross weakness but strength not formally tested. Skin: No rash, no sores or suspicious lesions. Nodes: No cervical adenopathy. Psych: Cooperative, pleasant, calm  Intake/Output from previous day: No intake/output data recorded. Intake/Output this shift: No intake/output data  recorded.  LAB RESULTS: Recent Labs    05/21/20 0615  WBC 8.3  HGB 10.9*  HCT 33.5*  PLT 245   BMET Lab Results  Component Value Date   NA 138 05/21/2020   NA 133 (L) 02/09/2020   NA 137 01/30/2020   K 3.7 05/21/2020   K 4.4 02/09/2020   K 4.0 01/30/2020   CL 106 05/21/2020   CL 97 02/09/2020   CL 102 01/30/2020   CO2 24 05/21/2020   CO2 29 02/09/2020   CO2 29 01/30/2020   GLUCOSE 108 (H) 05/21/2020   GLUCOSE 88 02/09/2020   GLUCOSE 98 01/30/2020   BUN 12 05/21/2020   BUN 15 02/09/2020   BUN 18 01/30/2020   CREATININE 0.78 05/21/2020   CREATININE 0.71 02/09/2020   CREATININE 0.73 01/30/2020   CALCIUM 8.2 (L) 05/21/2020   CALCIUM 9.0 02/09/2020   CALCIUM 8.5 01/30/2020   LFT Recent Labs    05/21/20 0615  PROT 6.0*  ALBUMIN 3.5  AST 16  ALT 16  ALKPHOS 28*  BILITOT 0.9   PT/INR No results found for: INR, PROTIME Hepatitis Panel  RADIOLOGY STUDIES: No results found.    IMPRESSION:   *  Post-polypectomy bleed.   Resection of 2 polyps > 1cm yesterday  *   Normocytic anemia w element of ABL.    *    IBS.     *   GERD.  Salmon colored mucasal island on Esophagus at yesterday's EGD, biopsied for suspicion Barretts.     PLAN:     *   Repeat colonoscopy, ~ 5 PM tonight.    *   q 6 H & H x 2, CBC in AM   Azucena Freed  05/21/2020, 8:21 AM Phone  [phone]  ________________________________________________________________________  Velora Heckler GI MD note:  I personally examined the patient, reviewed the data and agree with the assessment and plan described above.  Post polypectomy bleeding, either from the very proximal colon or the rectosigmoid site.  Currently she feels the bleeding is slowing down (as of 11am).  We are getting her ready for repeat procedure today, prepping as a full colonoscopy just in case the culprit is indeed in the right colon.  Owens Loffler, MD [facility] Pager [phone]

## 2020-05-21 NOTE — ED Notes (Signed)
Pt remains in NAD without acute pain.  Does c/o some nausea, yet requesting fluids.

## 2020-05-21 NOTE — Transfer of Care (Signed)
Immediate Anesthesia Transfer of Care Note  Patient: Michele Ali  Procedure(s) Performed: COLONOSCOPY WITH PROPOFOL (N/A ) HEMOSTASIS CLIP PLACEMENT  Patient Location: Endoscopy Unit  Anesthesia Type:MAC  Level of Consciousness: awake, alert  and oriented  Airway & Oxygen Therapy: Patient Spontanous Breathing and Patient connected to nasal cannula oxygen  Post-op Assessment: Report given to RN, Post -op Vital signs reviewed and stable and Patient moving all extremities  Post vital signs: Reviewed and stable  Last Vitals:  Vitals Value Taken Time  BP 108/62 05/21/20 1658  Temp    Pulse 98 05/21/20 1701  Resp 16 05/21/20 1701  SpO2 100 % 05/21/20 1701  Vitals shown include unvalidated device data.  Last Pain:  Vitals:   05/21/20 1658  TempSrc: (P) Oral  PainSc:       Patients Stated Pain Goal: 2 (18/98/42 1031)  Complications: No apparent anesthesia complications

## 2020-05-21 NOTE — H&P (Signed)
History and Physical    Michele Ali WUJ:[id number] DOB: [birth year] DOA: 05/21/2020  PCP: Imagene Riches, NP Consultants:  [facility] Patient coming from: Home -  With husband and kids:   Chief Complaint: BRBPR  HPI: Michele Ali is a very pleasant 49 y.o. female with medical history significant for fibromyalgia, depression/anxiety, migraines, GERD, anemia status post colonoscopy yesterday presents to the emergency department chief complaint of bright red blood per rectum.  Patient reports a history of lower abdominal pain.  She was seen in February by urgent care diagnosed with possible diverticulitis and given Cipro/Flagyl.  Yesterday she underwent endoscopic evaluation as well as a colonoscopy.  She reports she passed a small amount of blood while in the Endo center before discharge yesterday she got home expelled large volume red blood in the early evenings.  She reports frequent episodes during the night.  She came to the emergency department has had 4 episodes of bright red blood per rectum since she has been here.  Associated symptoms include left lower abdominal pain mild shortness of breath with activity and feeling lightheaded.  She denies any headache dizziness syncope or near syncope.  She denies nausea vomiting chest pain palpitations.  She denies any dysuria hematuria frequency or urgency.  She called her gastroenterologist who recommended she come to the emergency department.  [facility] were asked to admit  ED Course: In the emergency department she is provided with IV fluids.  She is hemodynamically stable.  Hemoglobin is 10.6.  She has had 4 episodes of bright red blood per rectum during her time in the emergency department so far.  Review of Systems: As per HPI; otherwise review of systems reviewed and negative.   Ambulatory Status:  Ambulates without assistance  Past Medical History:  Diagnosis Date  . Allergy   . Anemia   . Anxiety   . Asthma   . BRBPR (bright red  blood per rectum) 05/2020   post polypectomy  . 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 GERD (gastroesophageal reflux disease)   . Migraine   . Seizures ([place])    childhood ages [age] on medication but none since then  . Skin cancer    basal    Past Surgical History:  Procedure Laterality Date  . ANKLE SURGERY Right   . BASAL CELL CARCINOMA EXCISION     2016  left flank,  . CERVICAL SPINE SURGERY  06/2017   C5-C7  . ENDOMETRIAL ABLATION    . EXPLORATORY LAPAROTOMY    . NECK SURGERY     "lumpectomy"  . TUBAL LIGATION      Social History   Socioeconomic History  . Marital status: Married    Spouse name: Not on file  . Number of children: Not on file  . Years of education: Not on file  . Highest education level: Not on file  Occupational History  . Not on file  Tobacco Use  . Smoking status: Never Smoker  . Smokeless tobacco: Never Used  Substance and Sexual Activity  . Alcohol use: Yes    Comment: rarely  . Drug use: No  . Sexual activity: Not on file  Other Topics Concern  . Not on file  Social History Narrative   Lives at home, with husband and 2 kids and pets.  Works at [employer].  Jenny Reichmann is spouse.  Caffeine 1 cup coffee daily.   Social Determinants of Health   Financial Resource Strain:   .  Difficulty of Paying Living Expenses:   Food Insecurity:   . Worried About Charity fundraiser in the Last Year:   . Arboriculturist in the Last Year:   Transportation Needs:   . Film/video editor (Medical):   Marland Kitchen Lack of Transportation (Non-Medical):   Physical Activity:   . Days of Exercise per Week:   . Minutes of Exercise per Session:   Stress:   . Feeling of Stress :   Social Connections:   . Frequency of Communication with Friends and Family:   . Frequency of Social Gatherings with Friends and Family:   . Attends Religious Services:   . Active Member of Clubs or Organizations:   . Attends Archivist Meetings:   Marland Kitchen Marital  Status:   Intimate Partner Violence:   . Fear of Current or Ex-Partner:   . Emotionally Abused:   Marland Kitchen Physically Abused:   . Sexually Abused:     Allergies  Allergen Reactions  . Latex     Rash  . Penicillins     Rash , stomach pain  . Sulfa Antibiotics     Yeast infection    Family History  Problem Relation Age of Onset  . Crohn's disease Father   . Dementia Father   . Atrial fibrillation Father   . Ulcerative colitis Father   . Heart disease Mother   . Diverticulitis Mother   . Cancer Maternal Grandmother   . Cancer Paternal Grandmother   . Pancreatic disease Paternal Grandfather   . Colon cancer Other   . Esophageal cancer Neg Hx   . Rectal cancer Neg Hx   . Stomach cancer Neg Hx     Prior to Admission medications   Medication Sig Start Date End Date Taking? Authorizing Provider  ALBUTEROL SULFATE HFA IN Inhale 1 puff into the lungs every 6 (six) hours as needed (wheezing , shortness of breath.).    Yes [provider]  Butalbital-Acetaminophen (BUPAP) 50-300 MG TABS Take 1 tablet by mouth as needed (headaches.).  06/17/16  Yes [provider]  Cholecalciferol (VITAMIN D-1000 MAX ST) 1000 units tablet Take 1,000 Units by mouth daily.    Yes [provider]  clonazePAM (KLONOPIN) 0.5 MG tablet Take 0.5 mg by mouth 2 (two) times daily.    Yes [provider]  fluticasone (FLONASE) 50 MCG/ACT nasal spray Place 1 spray into the nose daily as needed for allergies or rhinitis.    Yes [provider]  loratadine (CLARITIN) 10 MG tablet Take 10 mg by mouth daily.   Yes [provider]  meloxicam (MOBIC) 15 MG tablet Take 7.5 mg by mouth in the morning and at bedtime.  01/07/18  Yes [provider]  methocarbamol (ROBAXIN) 750 MG tablet Take 750 mg by mouth as needed for muscle spasms.    Yes [provider]  Multiple Vitamin (MULTI-VITAMINS) TABS Take 1 tablet by mouth daily.    Yes [provider]    omeprazole (PRILOSEC) 40 MG capsule Take 1 capsule (40 mg total) by mouth daily. 01/30/20  Yes Noralyn Pick, NP  sertraline (ZOLOFT) 100 MG tablet Take 100 mg by mouth daily.   Yes [provider]  ciprofloxacin (CIPRO) 500 MG tablet Take 500 mg by mouth 2 (two) times daily. 01/20/20   [provider]  hyoscyamine (LEVSIN) 0.125 MG tablet Take 0.125 mg by mouth every 6 (six) hours as needed for cramping or pain. 01/20/20  [provider]  metroNIDAZOLE (FLAGYL) 500 MG tablet Take 500 mg by mouth 3 (three) times daily. 01/20/20   [provider]    Physical Exam: Vitals:   05/21/20 0606 05/21/20 0815 05/21/20 0830 05/21/20 1012  BP:  100/69 110/83 104/70  Pulse:  75 92 86  Resp:  17 16 16   Temp:      TempSrc:      SpO2:  100% 100% 100%  Weight: 55 kg     Height: 5\' 2"  (1.575 m)        . General:  Appears calm and comfortable and is NAD slightly pale . Eyes:  PERRL, EOMI, normal lids, iris . ENT:  grossly normal hearing, lips & tongue, mmm; appropriate dentition . Neck:  no LAD, masses or thyromegaly; no carotid bruits . Cardiovascular:  RRR, no m/r/g. No LE edema.  Marland Kitchen Respiratory:   CTA bilaterally with no wheezes/rales/rhonchi.  Normal respiratory effort. . Abdomen:  soft, NT, ND, bowel sounds throughout mild tenderness particularly in the left lower quadrant.  No guarding or rebounding . Back:   normal alignment, no CVAT . Skin:  no rash or induration seen on limited exam . Musculoskeletal:  grossly normal tone BUE/BLE, good ROM, no bony abnormality . Lower extremity:  No LE edema.  Limited foot exam with no ulcerations.  2+ distal pulses. Marland Kitchen Psychiatric:  grossly normal mood and affect, speech fluent and appropriate, AOx3 . Neurologic:  CN 2-12 grossly intact, moves all extremities in coordinated fashion, sensation intact    Radiological Exams on Admission: No results found.  EKG: Independently reviewed.  NSR    Labs on Admission:  I have personally reviewed the available labs and imaging studies at the time of the admission.  Pertinent labs:      Assessment/Plan Principal Problem:   BRBPR (bright red blood per rectum) Active Problems:   Acute blood loss anemia   GERD (gastroesophageal reflux disease)   Lower abdominal pain   #1.  Bright red blood per rectum/post polypectomy bleed.  Evaluated by gastroenterology who opine repeat colonoscopy this afternoon.  Chart review indicates yesterday's colonoscopy she underwent resection of 2 polyps.  -Clear liquids until 1:00 per gastroenterology -Gentle IV fluids -Monitor intake and output -serial H and H -supportive therapy  #2.  Acute blood loss anemia.  Hemoglobin 10.6.  Has a history of normocytic anemia. -Serial H&H -IV fluids -Vitals every 4x3 -Transfuse as indicated  #3.  GERD.  Chart review indicates endoscopy yesterday that revealed salmon-colored mucosal island on esophagus, biopsied for suspicion of Barrett's -Reglan x1 per ED -Supportive therapy     Note: This patient has been tested and is negative for the novel coronavirus COVID-19.  DVT prophylaxis:  SCDs Code Status:  Full  Family Communication: patient Disposition Plan:  Home once clinically improved Consults called: [facility] Admission status: obs    Tanyah Debruyne M NP [facility]   How to contact the [facility] Attending or Consulting provider [place] or covering provider during after hours [place], for this patient?  1. Check the care team in [facility] and look for a) attending/consulting [facility] provider listed and b) the [facility] team listed 2. Log into [email or web address] and use [place]'s universal password to access. If you do not have the password, please contact the hospital operator. 3. Locate the [facility] provider you are looking for under [facility] and page to a number that you can be directly reached. 4. If you still have difficulty reaching  the provider, please page the [facility] (Director  on Call) for the Hospitalists listed on amion for assistance.   05/21/2020, 10:38 AM

## 2020-05-22 DIAGNOSIS — K625 Hemorrhage of anus and rectum: Secondary | ICD-10-CM | POA: Diagnosis not present

## 2020-05-22 LAB — CBC
HCT: 24.8 % — ABNORMAL LOW (ref 36.0–46.0)
Hemoglobin: 8.3 g/dL — ABNORMAL LOW (ref 12.0–15.0)
MCH: 30.1 pg (ref 26.0–34.0)
MCHC: 33.5 g/dL (ref 30.0–36.0)
MCV: 89.9 fL (ref 80.0–100.0)
Platelets: 188 10*3/uL (ref 150–400)
RBC: 2.76 MIL/uL — ABNORMAL LOW (ref 3.87–5.11)
RDW: 12.3 % (ref 11.5–15.5)
WBC: 6.6 10*3/uL (ref 4.0–10.5)
nRBC: 0 % (ref 0.0–0.2)

## 2020-05-22 MED ORDER — SODIUM CHLORIDE 0.9 % IV SOLN
510.0000 mg | Freq: Once | INTRAVENOUS | Status: AC
  Administered 2020-05-22: 510 mg via INTRAVENOUS
  Filled 2020-05-22: qty 17

## 2020-05-22 NOTE — Progress Notes (Signed)
Discharge instructions reviewed with patient. All questions answered at this time. Transport provided from spouse.

## 2020-05-22 NOTE — Discharge Summary (Signed)
Physician Discharge Summary  Michele Ali YKD:[id number] DOB: 10/07/1971 DOA: 05/21/2020  PCP: Imagene Riches, NP  Admit date: 05/21/2020 Discharge date: 05/22/2020  Admitted From: home Discharge disposition: home   Recommendations for Outpatient Follow-Up:   1. Cbc 1 week   Discharge Diagnosis:   Principal Problem:   Rectal bleeding Active Problems:   Lower abdominal pain   GERD (gastroesophageal reflux disease)   Acute blood loss anemia   S/P colonoscopic polypectomy    Discharge Condition: Improved.  Diet recommendation: soft  Wound care: None.  Code status: Full.   History of Present Illness:   Michele Ali is a very pleasant 49 y.o. female with medical history significant for fibromyalgia, depression/anxiety, migraines, GERD, anemia status post colonoscopy yesterday presents to the emergency department chief complaint of bright red blood per rectum.  Patient reports a history of lower abdominal pain.  She was seen in February by urgent care diagnosed with possible diverticulitis and given Cipro/Flagyl.  Yesterday she underwent endoscopic evaluation as well as a colonoscopy.  She reports she passed a small amount of blood while in the Endo center before discharge yesterday she got home expelled large volume red blood in the early evenings.  She reports frequent episodes during the night.  She came to the emergency department has had 4 episodes of bright red blood per rectum since she has been here.  Associated symptoms include left lower abdominal pain mild shortness of breath with activity and feeling lightheaded.  She denies any headache dizziness syncope or near syncope.  She denies nausea vomiting chest pain palpitations.  She denies any dysuria hematuria frequency or urgency.  She called her gastroenterologist who recommended she come to the emergency department.  [facility] were asked to admit   Hospital Course by Problem:   Bright red blood per  rectum/post polypectomy bleed A 5 mm post polypectomy scar was found in the sigmoid colon with visible  vessel/pigment spot. To prevent bleeding after the polypectomy, two hemostatic clips were successfully placed (MR conditional). There was no bleeding at the end of the procedure.- Oral ferrous gluconate 1 tablet twice daily with  meals X3 months -patient says she does not tolerate PO iron-- gave dose of IV Fe   Medical Consultants:    GI  Discharge Exam:   Vitals:   05/21/20 2355 05/22/20 0603  BP: (!) 99/53 (!) 91/52  Pulse: 81 75  Resp: 16 16  Temp: 98.6 F (37 C) 98.4 F (36.9 C)  SpO2: 99% 100%   Vitals:   05/21/20 1714 05/21/20 1808 05/21/20 2355 05/22/20 0603  BP: 100/63 95/68 (!) 99/53 (!) 91/52  Pulse: 77 82 81 75  Resp: 19 16 16 16   Temp:  98.4 F (36.9 C) 98.6 F (37 C) 98.4 F (36.9 C)  TempSrc:  Oral Oral Oral  SpO2: 100% 100% 99% 100%  Weight:      Height:        General exam: Appears calm and comfortable.  The results of significant diagnostics from this hospitalization (including imaging, microbiology, ancillary and laboratory) are listed below for reference.     Procedures and Diagnostic Studies:   No results found.   Labs:   Basic Metabolic Panel: Recent Labs  Lab 05/21/20 0615  NA 138  K 3.7  CL 106  CO2 24  GLUCOSE 108*  BUN 12  CREATININE 0.78  CALCIUM 8.2*   GFR Estimated Creatinine Clearance: 67.7 mL/min (by C-G formula based on  SCr of 0.78 mg/dL). Liver Function Tests: Recent Labs  Lab 05/21/20 0615  AST 16  ALT 16  ALKPHOS 28*  BILITOT 0.9  PROT 6.0*  ALBUMIN 3.5   No results for input(s): LIPASE, AMYLASE in the last 168 hours. No results for input(s): AMMONIA in the last 168 hours. Coagulation profile Recent Labs  Lab 05/21/20 0615  INR 1.1    CBC: Recent Labs  Lab 05/21/20 0615 05/21/20 0920 05/21/20 1441 05/21/20 1948 05/22/20 0716  WBC 8.3 10.1  --   --  6.6  NEUTROABS 5.2  --   --   --   --     HGB 10.9* 10.6* 9.0* 8.6* 8.3*  HCT 33.5* 32.6* 27.8* 25.4* 24.8*  MCV 91.3 90.6  --   --  89.9  PLT 245 228  --   --  188   Cardiac Enzymes: No results for input(s): CKTOTAL, CKMB, CKMBINDEX, TROPONINI in the last 168 hours. BNP: Invalid input(s): POCBNP CBG: No results for input(s): GLUCAP in the last 168 hours. D-Dimer No results for input(s): DDIMER in the last 72 hours. Hgb A1c No results for input(s): HGBA1C in the last 72 hours. Lipid Profile No results for input(s): CHOL, HDL, LDLCALC, TRIG, CHOLHDL, LDLDIRECT in the last 72 hours. Thyroid function studies No results for input(s): TSH, T4TOTAL, T3FREE, THYROIDAB in the last 72 hours.  Invalid input(s): FREET3 Anemia work up No results for input(s): VITAMINB12, FOLATE, FERRITIN, TIBC, IRON, RETICCTPCT in the last 72 hours. Microbiology Recent Results (from the past 240 hour(s))  SARS Coronavirus 2 (TAT 6-24 hrs)     Status: None   Collection Time: 05/18/20 12:00 AM  Result Value Ref Range Status   SARS Coronavirus 2 RESULT: NEGATIVE  Final    Comment: RESULT: NEGATIVESARS-CoV-2 INTERPRETATION:A NEGATIVE  test result means that SARS-CoV-2 RNA was not present in the specimen above the limit of detection of this test. This does not preclude a possible SARS-CoV-2 infection and should not be used as the  sole basis for patient management decisions. Negative results must be combined with clinical observations, patient history, and epidemiological information. Optimum specimen types and timing for peak viral levels during infections caused by SARS-CoV-2  have not been determined. Collection of multiple specimens or types of specimens may be necessary to detect virus. Improper specimen collection and handling, sequence variability under primers/probes, or organism present below the limit of detection may  lead to false negative results. Positive and negative predictive values of testing are highly dependent on prevalence. False  negative test results are more likely when prevalence of disease is high.The expected result is NEGATIVE.Fact S heet for  Healthcare Providers: LocalChronicle.no Sheet for Patients: SalonLookup.es Reference Range - Negative   SARS CORONAVIRUS 2 (TAT 6-24 HRS) Nasopharyngeal Nasopharyngeal Swab     Status: None   Collection Time: 05/21/20 10:10 AM   Specimen: Nasopharyngeal Swab  Result Value Ref Range Status   SARS Coronavirus 2 NEGATIVE NEGATIVE Final    Comment: (NOTE) SARS-CoV-2 target nucleic acids are NOT DETECTED. The SARS-CoV-2 RNA is generally detectable in upper and lower respiratory specimens during the acute phase of infection. Negative results do not preclude SARS-CoV-2 infection, do not rule out co-infections with other pathogens, and should not be used as the sole basis for treatment or other patient management decisions. Negative results must be combined with clinical observations, patient history, and epidemiological information. The expected result is Negative. Fact Sheet for Patients: SugarRoll.be Fact Sheet for Healthcare Providers: https://www.woods-mathews.com/ This  test is not yet approved or cleared by the [place] and  has been authorized for detection and/or diagnosis of SARS-CoV-2 by FDA under an Emergency Use Authorization (EUA). This EUA will remain  in effect (meaning this test can be used) for the duration of the COVID-19 declaration under Section 56 4(b)(1) of the Act, 21 U.S.C. section 360bbb-3(b)(1), unless the authorization is terminated or revoked sooner. Performed at [facility], [place] [address] [place], [place] [address]      Discharge Instructions:   Discharge Instructions    Discharge instructions   Complete by: As directed    Cbc 1 week Soft diet   Discharge instructions   Complete by: As directed    Cbc 1 week Oral  ferrous gluconate 1 tablet twice daily with meals X3 months- recommendations per GI Soft diet   Increase activity slowly   Complete by: As directed      Allergies as of 05/22/2020      Reactions   Latex    Rash   Penicillins    Rash , stomach pain   Sulfa Antibiotics    Yeast infection      Medication List    STOP taking these medications   ciprofloxacin 500 MG tablet Commonly known as: CIPRO   meloxicam 15 MG tablet Commonly known as: MOBIC   metroNIDAZOLE 500 MG tablet Commonly known as: FLAGYL     TAKE these medications   ALBUTEROL SULFATE HFA IN Inhale 1 puff into the lungs every 6 (six) hours as needed (wheezing , shortness of breath.).   Bupap 50-300 MG Tabs Generic drug: Butalbital-Acetaminophen Take 1 tablet by mouth as needed (headaches.).   clonazePAM 0.5 MG tablet Commonly known as: KLONOPIN Take 0.5 mg by mouth 2 (two) times daily.   fluticasone 50 MCG/ACT nasal spray Commonly known as: FLONASE Place 1 spray into the nose daily as needed for allergies or rhinitis.   hyoscyamine 0.125 MG tablet Commonly known as: LEVSIN Take 0.125 mg by mouth every 6 (six) hours as needed for cramping or pain.   loratadine 10 MG tablet Commonly known as: CLARITIN Take 10 mg by mouth daily.   methocarbamol 750 MG tablet Commonly known as: ROBAXIN Take 750 mg by mouth as needed for muscle spasms.   Multi-Vitamins Tabs Take 1 tablet by mouth daily.   omeprazole 40 MG capsule Commonly known as: PRILOSEC Take 1 capsule (40 mg total) by mouth daily.   sertraline 100 MG tablet Commonly known as: ZOLOFT Take 100 mg by mouth daily.   Vitamin D-1000 Max St 25 MCG (1000 UT) tablet Generic drug: Cholecalciferol Take 1,000 Units by mouth daily.      Follow-up Information    Imagene Riches, NP Follow up in 1 week(s).   Why: cbc Contact information: [place] [address] [phone]            Time coordinating discharge: 25  min  Signed:  Geradine Girt DO  [facility] 05/22/2020, 8:44 AM

## 2020-05-24 ENCOUNTER — Telehealth: Payer: Self-pay

## 2020-05-24 ENCOUNTER — Other Ambulatory Visit: Payer: Self-pay

## 2020-05-24 ENCOUNTER — Telehealth: Payer: Self-pay | Admitting: *Deleted

## 2020-05-24 DIAGNOSIS — K922 Gastrointestinal hemorrhage, unspecified: Secondary | ICD-10-CM

## 2020-05-24 MED ORDER — FERROUS GLUCONATE 324 (38 FE) MG PO TABS: 324.0000 mg | ORAL_TABLET | Freq: Every day | ORAL | 0 refills | Status: DC

## 2020-05-24 MED ORDER — OMEPRAZOLE 40 MG PO CPDR: 40.0000 mg | DELAYED_RELEASE_CAPSULE | Freq: Every day | ORAL | 6 refills | Status: DC

## 2020-05-24 NOTE — Telephone Encounter (Signed)
Thank you for callback message. We were going to reach out to her this week. [name], please call patient and set up a CBC and she can have that done on Wed/Thursday. If she is not having any further bleeding, then I would start her on oral Iron (Ferrous gluconate 324 mg daily in 1-week - next Monday) to help build up her stores. I will try to reach out to her today or tomorrow. Thank you. GM

## 2020-05-24 NOTE — Telephone Encounter (Signed)
Attempted f/u phone call. No answer. Left message. °

## 2020-05-24 NOTE — Telephone Encounter (Signed)
-----   Message from Irving Copas., MD sent at 05/21/2020  5:05 PM EDT ----- Regarding: Follow-up Michele Ali,Can you reach out to the patient next week?She had to come into the hospital due to post polypectomy bleeding concern and Dr. Silverio Decamp took care of her on Friday.I am not sure if she will be discharged on Friday or Saturday.Thanks.GM

## 2020-05-24 NOTE — Telephone Encounter (Signed)
Called and spoke with patient this afternoon for 15 minutes.  She has had some continued chest discomfort in the lower chest post endoscopy.  Only took biopsies without dilation.  This is not a sore throat per her report.  More when she swallows has discomfort.  Cold drinks help this.  Not clear what is causing this but offered GI Cocktail but she wants to wait 1-2 more days to see how this goes.  [name], when you check on patient tomorrow PM, if she is still having issues then would do GI Cocktail as we have previously used for a few days.  She has felt fatigued and weak but stable since discharge.  We will plan a CBC on Thursday of this week to see how things settle.  She is going to be cooking/eating Iron rich foods for the next few weeks.  I recommend initiating Ferrous gluconate 324 mg daily for 48-month.  She can start this on Saturday of this week.  We will send a prescription to [facility] in [place].    She is out of Omeprazole.  We will send prescription refills for this.  She needs a work note from the date of her procedure.  I think it is reasonable due to the significance of her bleeding, that she will be fatigued for the next few days.  I think this week she should be able to be limited in her work capacities as she heals and begins rebuilding her Iron stores.  We will set her up with a Work Note for the dates of her Colonoscopy/Hospitalization and through this work week.  [name], if you can get that sent in the mail today, the patient is OK with this.  If it cannot get into the mail today then print it out and we can have available for patient to pick up on Thursday when she comes in for the CBC.  Pathology still pending from her colonoscopy.  We will see how her counts look.  Patient appreciative for callback and discussion today.  Justice Britain, MD [facility] Advanced Endoscopy Office # [phone]

## 2020-05-24 NOTE — Telephone Encounter (Signed)
Spoke with patient regarding CBC on Thursday, pt aware that RX for Ferrous gluconate has been sent into pharmacy as well as refill of Omeprazole. Letter will be mailed today.

## 2020-05-24 NOTE — Telephone Encounter (Signed)
  Follow up Call-  Call back number 05/20/2020  Post procedure Call Back phone  # [phone]  Permission to leave phone message Yes     Patient questions:  Do you have a fever, pain , or abdominal swelling? No. Pain Score  0 *  Have you tolerated food without any problems? No.  Pt states lower esophagus feels very tight and worse than before the EGD.  Have you been able to return to your normal activities? No.  Patient states she had gone to the hospital d/t excessive bleeding.  Pt was admitted to the hospital and received an iron infusion as well has having 6 clips placed to control bleeding.  Hgb was 8.3 on discharge.  Patient says she feels very weak and fears driving with the way she feels.  She was told to have a CBC done by the end of the week to see where her Hgb is now.  She states her PCP is closed this week and unable to get an order to get the lab work done.  Wants to know if we can input lab order here and her come here to get the CBC done.    Do you have any questions about your discharge instructions: Diet   No. Medications  No. Follow up visit  No.  Do you have questions or concerns about your Care? Yes.    Actions: * If pain score is 4 or above: Physician/ provider Notified : Justice Britain, MD.  1. Have you developed a fever since your procedure? no  2.   Have you had an respiratory symptoms (SOB or cough) since your procedure? no  3.   Have you tested positive for COVID 19 since your procedure no  4.   Have you had any family members/close contacts diagnosed with the COVID 19 since your procedure?  no   If yes to any of these questions please route to Joylene John, RN and Erenest Rasher, RN

## 2020-05-24 NOTE — Telephone Encounter (Signed)
Lab order entered pt aware

## 2020-05-24 NOTE — Anesthesia Postprocedure Evaluation (Signed)
Anesthesia Post Note  Patient: Michele Ali  Procedure(s) Performed: COLONOSCOPY WITH PROPOFOL (N/A ) HEMOSTASIS CLIP PLACEMENT     Patient location during evaluation: Endoscopy Anesthesia Type: MAC Level of consciousness: awake and alert Pain management: pain level controlled Vital Signs Assessment: post-procedure vital signs reviewed and stable Respiratory status: spontaneous breathing, nonlabored ventilation and respiratory function stable Cardiovascular status: blood pressure returned to baseline and stable Postop Assessment: no apparent nausea or vomiting Anesthetic complications: no    Last Vitals:  Vitals:   05/22/20 0603 05/22/20 1054  BP: (!) 91/52 105/69  Pulse: 75 76  Resp: 16 16  Temp: 36.9 C 36.6 C  SpO2: 100% 100%    Last Pain:  Vitals:   05/22/20 1054  TempSrc: Oral  PainSc:                  Lynda Rainwater

## 2020-05-24 NOTE — Telephone Encounter (Signed)
Michele Ali sent a message to Dr Rush Landmark regarding some questions the pt has and would also like to have CBC done here at our facility is this ok?

## 2020-05-26 ENCOUNTER — Encounter: Payer: Self-pay | Admitting: Gastroenterology

## 2020-05-27 ENCOUNTER — Other Ambulatory Visit: Payer: Self-pay

## 2020-05-27 ENCOUNTER — Other Ambulatory Visit (INDEPENDENT_AMBULATORY_CARE_PROVIDER_SITE_OTHER): Payer: No Typology Code available for payment source

## 2020-05-27 DIAGNOSIS — K922 Gastrointestinal hemorrhage, unspecified: Secondary | ICD-10-CM | POA: Diagnosis not present

## 2020-05-27 LAB — CBC WITH DIFFERENTIAL/PLATELET
Basophils Absolute: 0 10*3/uL (ref 0.0–0.1)
Basophils Relative: 0.5 % (ref 0.0–3.0)
Eosinophils Absolute: 0.1 10*3/uL (ref 0.0–0.7)
Eosinophils Relative: 1.9 % (ref 0.0–5.0)
HCT: 28.8 % — ABNORMAL LOW (ref 36.0–46.0)
Hemoglobin: 9.6 g/dL — ABNORMAL LOW (ref 12.0–15.0)
Lymphocytes Relative: 35 % (ref 12.0–46.0)
Lymphs Abs: 2.5 10*3/uL (ref 0.7–4.0)
MCHC: 33.5 g/dL (ref 30.0–36.0)
MCV: 90.5 fl (ref 78.0–100.0)
Monocytes Absolute: 0.6 10*3/uL (ref 0.1–1.0)
Monocytes Relative: 8.8 % (ref 3.0–12.0)
Neutro Abs: 3.9 10*3/uL (ref 1.4–7.7)
Neutrophils Relative %: 53.8 % (ref 43.0–77.0)
Platelets: 256 10*3/uL (ref 150.0–400.0)
RBC: 3.18 Mil/uL — ABNORMAL LOW (ref 3.87–5.11)
RDW: 13.4 % (ref 11.5–15.5)
WBC: 7.2 10*3/uL (ref 4.0–10.5)

## 2020-06-10 ENCOUNTER — Telehealth: Payer: Self-pay

## 2020-06-10 ENCOUNTER — Other Ambulatory Visit (INDEPENDENT_AMBULATORY_CARE_PROVIDER_SITE_OTHER): Payer: No Typology Code available for payment source

## 2020-06-10 DIAGNOSIS — K922 Gastrointestinal hemorrhage, unspecified: Secondary | ICD-10-CM | POA: Diagnosis not present

## 2020-06-10 LAB — CBC WITH DIFFERENTIAL/PLATELET
Basophils Absolute: 0 10*3/uL (ref 0.0–0.1)
Basophils Relative: 0.7 % (ref 0.0–3.0)
Eosinophils Absolute: 0.1 10*3/uL (ref 0.0–0.7)
Eosinophils Relative: 1.7 % (ref 0.0–5.0)
HCT: 32.7 % — ABNORMAL LOW (ref 36.0–46.0)
Hemoglobin: 11 g/dL — ABNORMAL LOW (ref 12.0–15.0)
Lymphocytes Relative: 35.2 % (ref 12.0–46.0)
Lymphs Abs: 1.8 10*3/uL (ref 0.7–4.0)
MCHC: 33.5 g/dL (ref 30.0–36.0)
MCV: 93.1 fl (ref 78.0–100.0)
Monocytes Absolute: 0.5 10*3/uL (ref 0.1–1.0)
Monocytes Relative: 10 % (ref 3.0–12.0)
Neutro Abs: 2.7 10*3/uL (ref 1.4–7.7)
Neutrophils Relative %: 52.4 % (ref 43.0–77.0)
Platelets: 200 10*3/uL (ref 150.0–400.0)
RBC: 3.52 Mil/uL — ABNORMAL LOW (ref 3.87–5.11)
RDW: 15.9 % — ABNORMAL HIGH (ref 11.5–15.5)
WBC: 5.2 10*3/uL (ref 4.0–10.5)

## 2020-06-10 NOTE — Telephone Encounter (Signed)
-----   Message from Yevette Edwards, RN sent at 05/27/2020  1:34 PM EDT ----- Repeat CBC , order in epic

## 2020-06-10 NOTE — Telephone Encounter (Signed)
Spoke with patient to remind her of repeat labs this week, pt advised that no appt is necessary and that order is at the lab.

## 2020-06-11 ENCOUNTER — Other Ambulatory Visit: Payer: Self-pay

## 2020-06-11 DIAGNOSIS — K922 Gastrointestinal hemorrhage, unspecified: Secondary | ICD-10-CM

## 2020-06-18 ENCOUNTER — Other Ambulatory Visit: Payer: Self-pay | Admitting: Gastroenterology

## 2020-06-18 DIAGNOSIS — K922 Gastrointestinal hemorrhage, unspecified: Secondary | ICD-10-CM

## 2020-07-27 ENCOUNTER — Ambulatory Visit: Payer: No Typology Code available for payment source | Admitting: Gastroenterology

## 2020-07-27 ENCOUNTER — Telehealth: Payer: Self-pay | Admitting: Gastroenterology

## 2020-07-27 NOTE — Telephone Encounter (Signed)
I am sorry to hear about this. She can be rescheduled next available with me or if needed with one of the APP's. Thanks. GM
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
## 2020-09-24 ENCOUNTER — Ambulatory Visit (INDEPENDENT_AMBULATORY_CARE_PROVIDER_SITE_OTHER): Payer: No Typology Code available for payment source | Admitting: Gastroenterology

## 2020-09-24 ENCOUNTER — Other Ambulatory Visit: Payer: Self-pay | Admitting: Gastroenterology

## 2020-09-24 ENCOUNTER — Encounter: Payer: Self-pay | Admitting: Gastroenterology

## 2020-09-24 VITALS — BP 90/60 | HR 64 | Ht 61.5 in | Wt 118.1 lb

## 2020-09-24 DIAGNOSIS — K2100 Gastro-esophageal reflux disease with esophagitis, without bleeding: Secondary | ICD-10-CM | POA: Diagnosis not present

## 2020-09-24 DIAGNOSIS — Z86010 Personal history of colonic polyps: Secondary | ICD-10-CM

## 2020-09-24 DIAGNOSIS — Z8719 Personal history of other diseases of the digestive system: Secondary | ICD-10-CM

## 2020-09-24 MED ORDER — OMEPRAZOLE 20 MG PO CPDR
20.0000 mg | DELAYED_RELEASE_CAPSULE | Freq: Every day | ORAL | 3 refills | Status: DC
Start: 2020-09-24 — End: 2020-09-28

## 2020-09-24 NOTE — Patient Instructions (Signed)
If you are age 48 or older, your body mass index should be between 23-30. Your Body mass index is 21.96 kg/m. If this is out of the aforementioned range listed, please consider follow up with your Primary Care Provider.  If you are age 28 or younger, your body mass index should be between 19-25. Your Body mass index is 21.96 kg/m. If this is out of the aformentioned range listed, please consider follow up with your Primary Care Provider.    Decrease Omeprazole to 20mg  once daily.   We have sent the following medications to your pharmacy for you to pick up at your convenience:  Omeprazole 20mg    Follow-up in 1 year. Office to call for appointment.   Thank you for choosing me and [facility].  Dr. Rush Landmark

## 2020-09-27 ENCOUNTER — Encounter: Payer: Self-pay | Admitting: Gastroenterology

## 2020-09-27 DIAGNOSIS — Z86010 Personal history of colonic polyps: Secondary | ICD-10-CM | POA: Insufficient documentation

## 2020-09-27 DIAGNOSIS — Z8719 Personal history of other diseases of the digestive system: Secondary | ICD-10-CM | POA: Insufficient documentation

## 2020-09-27 NOTE — Progress Notes (Addendum)
[place] VISIT   Primary Care Provider Imagene Riches, [place] [address] [phone]  Patient Profile: Michele Ali is a 49 y.o. female with a pmh significant for allergies, asthma, anxiety, prior skin cancers (BCC) protein S deficiency, GERD, colon polyps (TA and SSP), hemorrhoids.  The patient presents to the [facility] for an evaluation and management of problem(s) noted below:  Problem List 1. History of colonic polyps   2. Gastroesophageal reflux disease with esophagitis without hemorrhage   3. History of lower GI bleeding-secondary to post polypectomy bleeding     History of Present Illness Please see initial consultation note by NP [facility] for full details of HPI.  Interval History Patient returns for scheduled follow-up.  After her June EGD/colonoscopy the patient had significant bleeding post procedure within 24 hours of required [place] to send her to the emergency department.  She was prepped and underwent repeat colonoscopy with finding of a visible vessel cauterized polyp resection site that required therapy.  The other cold snare resection was closed with clips although no active bleeding was noted.  Patient had evidence of development of anemia but did not require any blood transfusions.  She has been on oral iron since her discharge hemoglobin showed persistent anemia.  She did not had any recurrence of her episodes of bleeding.  Her previous abdominal discomforts are improved at this time.  She is wondering if she can decrease her PPI therapy.  She has had chronic GERD symptoms for years and believes she has been on other PPI therapies.  She is not sure she wants surgical intervention or endoscopic intervention for GERD due to her concern of her gastritis-like discomforts that she has been concern of whether she would be able to evacuate or vomit if she had a fundoplication completed.  The patient's weight is  stable.  She is using NSAIDs on an as-needed basis.  GI Review of Systems Positive as above Negative for dysphagia, odynophagia, pain, change in bowel habits, melena, hematochezia  Review of Systems General: Denies fevers/chills/weight loss unintentionally HEENT: Denies oral lesions Cardiovascular: Denies chest pain Pulmonary: Denies shortness of breath Gastroenterological: See HPI Genitourinary: Denies darkened urine Hematological: Positive for history of easy bruising/bleeding Dermatological: Denies jaundice Psychological: Mood is stable   Medications Current Outpatient Medications  Medication Sig Dispense Refill  . ALBUTEROL SULFATE HFA IN Inhale 1 puff into the lungs every 6 (six) hours as needed (wheezing , shortness of breath.).     Marland Kitchen Butalbital-Acetaminophen (BUPAP) 50-300 MG TABS Take 1 tablet by mouth as needed (headaches.).     Marland Kitchen Cholecalciferol (VITAMIN D-1000 MAX ST) 1000 units tablet Take 1,000 Units by mouth daily.     . clonazePAM (KLONOPIN) 0.5 MG tablet Take 0.5 mg by mouth 2 (two) times daily.     . ferrous gluconate (FERGON) 324 MG tablet TAKE 1 TABLET BY MOUTH DAILY WITH BREAKFAST 30 tablet 2  . fluticasone (FLONASE) 50 MCG/ACT nasal spray Place 1 spray into the nose daily as needed for allergies or rhinitis.     Marland Kitchen loratadine (CLARITIN) 10 MG tablet Take 10 mg by mouth daily.    . meloxicam (MOBIC) 15 MG tablet Take 7.5 mg by mouth 2 (two) times daily.    . methocarbamol (ROBAXIN) 750 MG tablet Take 750 mg by mouth as needed for muscle spasms.     . Multiple Vitamin (MULTI-VITAMINS) TABS Take 1 tablet by mouth daily.     Marland Kitchen  sertraline (ZOLOFT) 100 MG tablet Take 100 mg by mouth daily.    Marland Kitchen omeprazole (PRILOSEC) 20 MG capsule Take 1 capsule (20 mg total) by mouth daily. 90 capsule 3   No current facility-administered medications for this visit.    Allergies Allergies  Allergen Reactions  . Latex     Rash  . Penicillins     Rash , stomach pain  . Sulfa  Antibiotics     Yeast infection    Histories Past Medical History:  Diagnosis Date  . Allergy   . Anemia   . Anxiety   . Asthma   . BRBPR (bright red blood per rectum) 05/2020   post polypectomy  . Clotting disorder ([place])    protien S deficiency   . GERD (gastroesophageal reflux disease)   . Migraine   . Seizures ([place])    childhood ages [age] on medication but none since then  . Skin cancer    basal   Past Surgical History:  Procedure Laterality Date  . ANKLE SURGERY Right   . BASAL CELL CARCINOMA EXCISION     2016  left flank,  . CERVICAL SPINE SURGERY  06/2017   C5-C7  . COLONOSCOPY  05/20/2020  . COLONOSCOPY WITH PROPOFOL N/A 05/21/2020   Procedure: COLONOSCOPY WITH PROPOFOL;  Surgeon: Mauri Pole, MD;  Location: [place] [facility];  Service: Endoscopy;  Laterality: N/A;  . ENDOMETRIAL ABLATION    . EXPLORATORY LAPAROTOMY    . HEMOSTASIS CLIP PLACEMENT  05/21/2020   Procedure: HEMOSTASIS CLIP PLACEMENT;  Surgeon: Mauri Pole, MD;  Location: [place];  Service: Endoscopy;;  . NECK SURGERY     "lumpectomy"  . TUBAL LIGATION     Social History   Socioeconomic History  . Marital status: Married    Spouse name: Not on file  . Number of children: Not on file  . Years of education: Not on file  . Highest education level: Not on file  Occupational History  . Not on file  Tobacco Use  . Smoking status: Never Smoker  . Smokeless tobacco: Never Used  Vaping Use  . Vaping Use: Never used  Substance and Sexual Activity  . Alcohol use: Yes    Comment: rarely  . Drug use: No  . Sexual activity: Not on file  Other Topics Concern  . Not on file  Social History Narrative   Lives at home, with husband and 2 kids and pets.  Works at [employer].  Jenny Reichmann is spouse.  Caffeine 1 cup coffee daily.   Social Determinants of Health   Financial Resource Strain:   . Difficulty of Paying Living Expenses: Not on file  Food Insecurity:   . Worried About Paediatric nurse in the Last Year: Not on file  . Ran Out of Food in the Last Year: Not on file  Transportation Needs:   . Lack of Transportation (Medical): Not on file  . Lack of Transportation (Non-Medical): Not on file  Physical Activity:   . Days of Exercise per Week: Not on file  . Minutes of Exercise per Session: Not on file  Stress:   . Feeling of Stress : Not on file  Social Connections:   . Frequency of Communication with Friends and Family: Not on file  . Frequency of Social Gatherings with Friends and Family: Not on file  . Attends Religious Services: Not on file  . Active Member of Clubs or Organizations: Not on file  . Attends  Club or Organization Meetings: Not on file  . Marital Status: Not on file  Intimate Partner Violence:   . Fear of Current or Ex-Partner: Not on file  . Emotionally Abused: Not on file  . Physically Abused: Not on file  . Sexually Abused: Not on file   Family History  Problem Relation Age of Onset  . Crohn's disease Father   . Dementia Father   . Atrial fibrillation Father   . Ulcerative colitis Father   . Heart disease Mother   . Diverticulitis Mother   . Cancer Maternal Grandmother   . Cancer Paternal Grandmother   . Pancreatic disease Paternal Grandfather   . Colon cancer Other   . Esophageal cancer Neg Hx   . Rectal cancer Neg Hx   . Stomach cancer Neg Hx   . Inflammatory bowel disease Neg Hx   . Liver disease Neg Hx   . Pancreatic cancer Neg Hx    I have reviewed her medical, social, and family history in detail and updated the electronic medical record as necessary.    PHYSICAL EXAMINATION  BP 90/60 (BP Location: Left Arm, Patient Position: Sitting, Cuff Size: Normal)   Pulse 64   Ht 5' 1.5" (1.562 m) Comment: height measured without shoes  Wt 118 lb 2 oz (53.6 kg)   BMI 21.96 kg/m  Wt Readings from Last 3 Encounters:  09/24/20 118 lb 2 oz (53.6 kg)  05/21/20 110 lb (49.9 kg)  05/20/20 114 lb (51.7 kg)  GEN: NAD, appears  stated age, doesn't appear chronically ill PSYCH: Cooperative, without pressured speech EYE: Conjunctivae pink, sclerae anicteric ENT: MMM CV: Nontachycardic RESP: No audible wheezing GI: NABS, soft, NT/ND, without rebound or guarding MSK/EXT: No lower extremity edema SKIN: No jaundice NEURO:  Alert & Oriented x 3, no focal deficits   REVIEW OF DATA  I reviewed the following data at the time of this encounter:  GI Procedures and Studies  June 2021 colonoscopy - Perianal skin tags and hemorrhoids were found on perianal exam. - The examined portion of the ileum was normal. - One 15 mm polyp in the proximal ascending colon, removed using lift and cut and a cold snare. Resected and retrieved. - One 13 mm polyp at the recto-sigmoid colon, removed with mucosal resection. Resected and retrieved. - Normal mucosa in the entire examined colon otherwise. - Non-bleeding non-thrombosed internal hemorrhoids.  June 2021 EGD - No gross lesions in esophagus proximally. Salmon-colored mucosa island suspicious for Barrett's esophagus. Biopsied. - Z-line irregular, 36 cm from the incisors. - Multiple gastric polyps - fundic gland appearing. Biopsied. - No other gross lesions in the stomach. Biopsied. - No gross lesions in the duodenal bulb, in the first portion of the duodenum and in the second portion of the duodenum.  Pathology Diagnosis 1. Surgical [P], duodenal - BENIGN SMALL BOWEL MUCOSA. - NO ACTIVE INFLAMMATION OR VILLOUS ATROPHY IDENTIFIED. 2. Surgical [P], gastric - CHRONIC INACTIVE GASTRITIS. - THERE IS NO EVIDENCE OF HELICOBACTER PYLORI, DYSPLASIA OR MALIGNANCY. - SEE COMMENT. 3. Surgical [P], gastric polyp - FUNDIC GLAND POLYP(S). - THERE IS NO EVIDENCE OF MALIGNANCY. 4. Surgical [P], esophageal - BENIGN SQUAMOUS MUCOSA. - THERE IS NO EVIDENCE OF INCREASE IN EOSINOPHILS, DYSPLASIA OR MALIGNANCY. - SEE COMMENT. 5. Surgical [P], distal esophagus - MINIMALLY INFLAMED SQUAMOUS  LINED MUCOSA. - THERE IS NO EVIDENCE OF GOBLET CELL METAPLASIA, DYSPLASIA OR MALIGNANCY. - SEE COMMENT. 6. Surgical [P], colon, ascending, rectosigmoid, polyp (2) - TUBULAR ADENOMA(S). - SESSILE SERRATED  POLYP WITHOUT CYTOLOGIC DYSPLASIA. - HIGH GRADE DYSPLASIA IS NOT IDENTIFIED.  June 2021 colonoscopy - Post-polypectomy scar in the ascending colon. Clips (MR conditional) were placed to close the defect. - Post-polypectomy scar in the sigmoid colon with pigment spot and visible vessel likely site of bleed. Clips (MR conditional) were placed to close the defect and prevent recurrent bleeding.  Laboratory Studies  Reviewed those in epic  Imaging Studies  No new studies to review   ASSESSMENT  Ms. Briggs is a 49 y.o. female with a pmh significant for allergies, asthma, anxiety, prior skin cancers (BCC) protein S deficiency, GERD, colon polyps (TA and SSP), hemorrhoids.  The patient is today for evaluation and management of:  1. History of colonic polyps   2. Gastroesophageal reflux disease with esophagitis without hemorrhage   3. History of lower GI bleeding-secondary to post polypectomy bleeding    The patient is hemodynamically and clinically stable.  Her chronic GERD symptoms are currently controlled and she is wondering about transitioning to once daily 20 mg of PPI.  I think it is reasonable for [place] to trial this.  Certainly if she has worsening of her symptoms we can go back up to 40 mg daily.  We did briefly discuss potential role of surgical or endoscopic management of chronic GERD symptoms.  She is holding on this currently but may think about this in the future.  We would consider the role of pH studies as well as manometry prior to any sort of surgical management.  The patient has not had any recurrence of her bleeding and the letter to the hospital post procedure after colon polyp resection with cautery.  Suspected protein S deficiency played a role in this.  However, she has large  precancerous polyps and will require follow-up procedure in approximately 3 years.  At the time that comes we will consider performing her procedure in the hospital-based setting where clinical management can be utilized in a more efficient manner to hopefully decrease risk of bleeding again in the future.  We will see how she does on 20 mg of daily omeprazole and plan for a 1 year follow-up or as needed.  All patient questions were answered to the best of my ability, and the patient agrees to the aforementioned plan of action with follow-up as indicated.   PLAN  Decrease omeprazole to 20 mg daily and monitor symptoms Holding on pH impedance and esophageal manometry as patient wants to hold on endoscopic/surgical management symptoms currently -Can consider in future Colonoscopy in 3-years for colon polyp surveillance -Consider hospital-based and clipping of polypectomy sites in future due to her history of postprocedural bleeding    No orders of the defined types were placed in this encounter.   New Prescriptions   OMEPRAZOLE (PRILOSEC) 20 MG CAPSULE    Take 1 capsule (20 mg total) by mouth daily.   Modified Medications   No medications on file    Planned Follow Up No follow-ups on file.   Total Time in Face-to-Face and in Coordination of Care for patient including independent/personal interpretation/review of prior testing, medical history, examination, medication adjustment, communicating results with the patient directly, and documentation with the EHR is 30 minutes.   Justice Britain, MD [facility] Office # [phone]

## 2020-09-28 ENCOUNTER — Other Ambulatory Visit: Payer: Self-pay

## 2020-09-28 MED ORDER — OMEPRAZOLE 20 MG PO CPDR: 20.0000 mg | DELAYED_RELEASE_CAPSULE | Freq: Every day | ORAL | 3 refills | Status: AC

## 2021-07-10 IMAGING — CT CT ABD-PELV W/ CM
2 of 5 series · 15 of 46 positions shown, 17 images · IV contrast (OMNIPAQUE 300)
Comparison: None.

CLINICAL DATA: Left lower quadrant pain and bloating for 3 weeks.

EXAM:
CT ABDOMEN AND PELVIS WITH CONTRAST
TECHNIQUE: Multidetector CT imaging of the abdomen and pelvis was performed
using the standard protocol following bolus administration of
intravenous contrast.
CONTRAST:  100mL OMNIPAQUE IOHEXOL 300 MG/ML  SOLN

[Series 2: abd/pel w · axial · 0.63mm/px · z∈[-416,-76]mm · 12 of 76 slices shown, 14 images]
[im 4/76  soft-tissue]
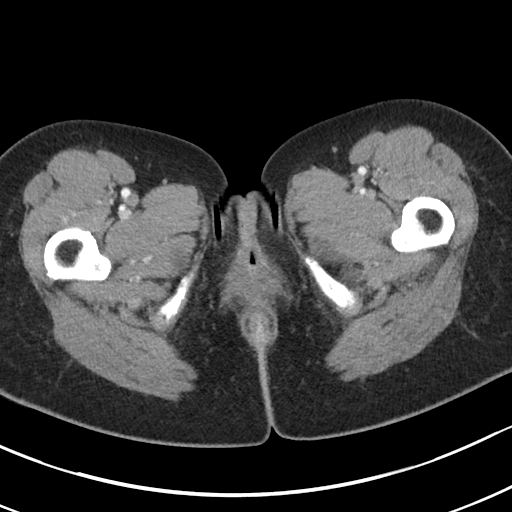
[im 4/76  bone]
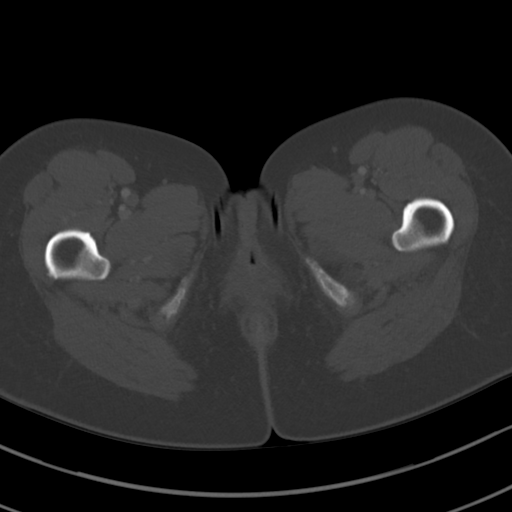
[im 12/76  soft-tissue]
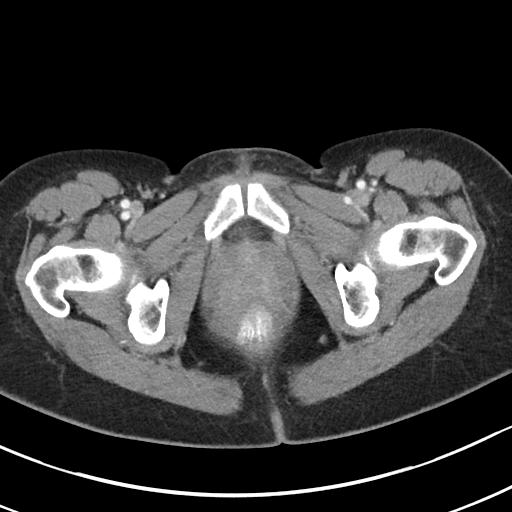
[im 16/76  soft-tissue]
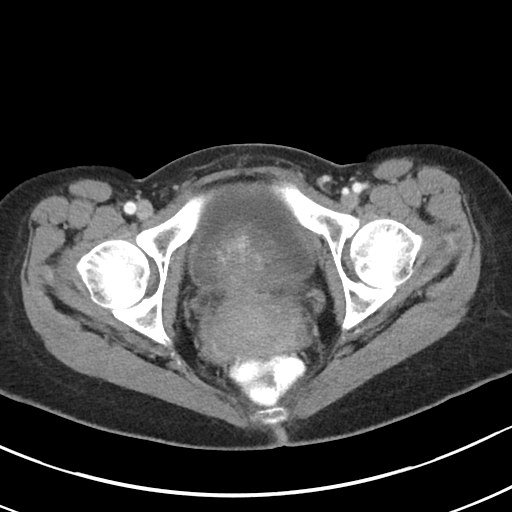
[im 23/76  soft-tissue]
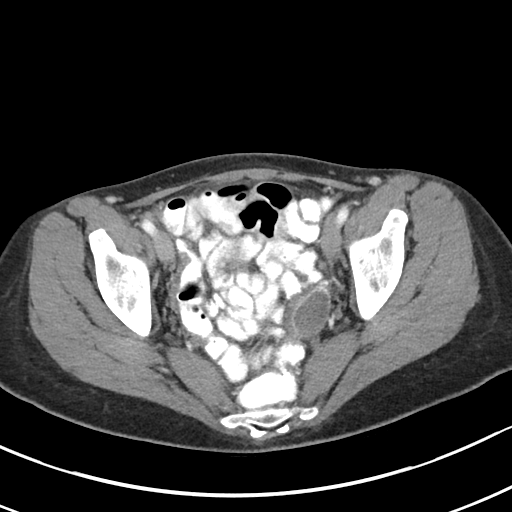
[im 31/76  soft-tissue]
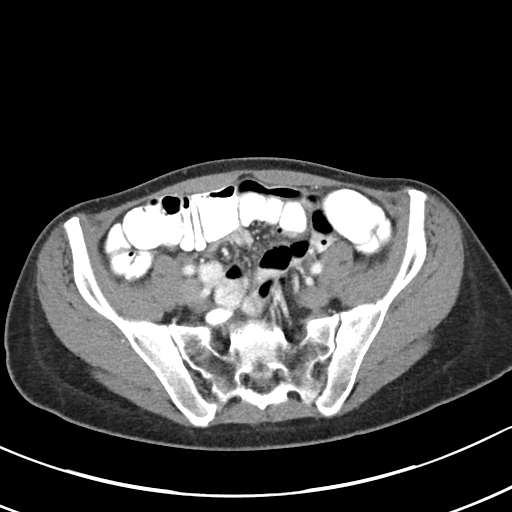
[im 34/76  soft-tissue]
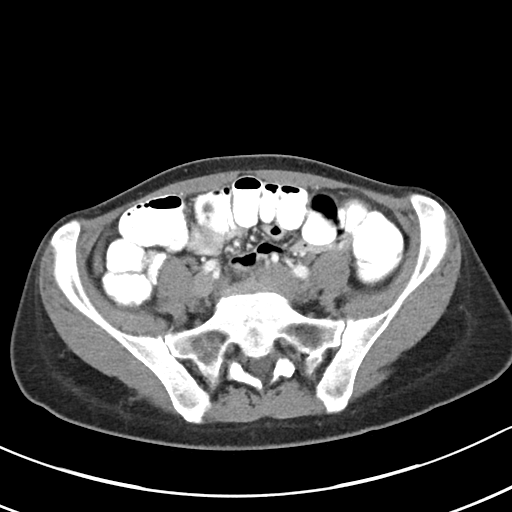
[im 42/76  soft-tissue]
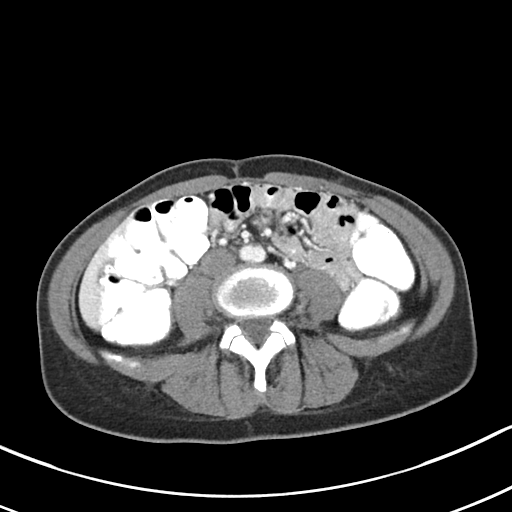
[im 46/76  soft-tissue]
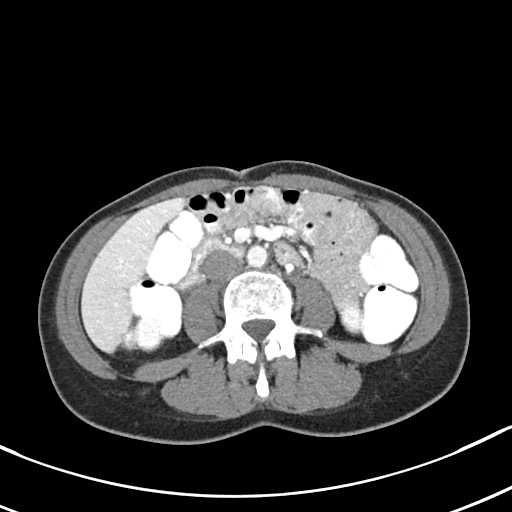
[im 53/76  soft-tissue]
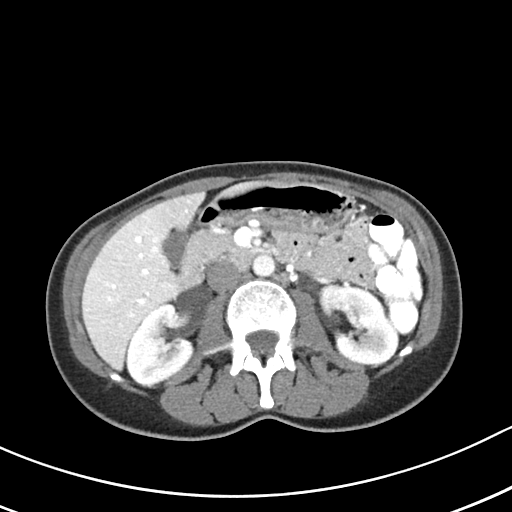
[im 53/76  bone]
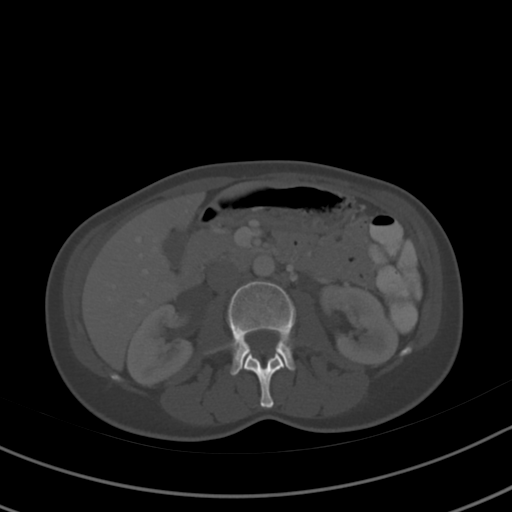
[im 61/76  soft-tissue]
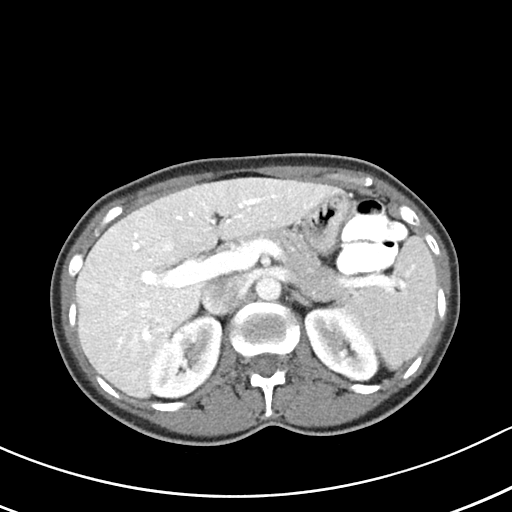
[im 64/76  soft-tissue]
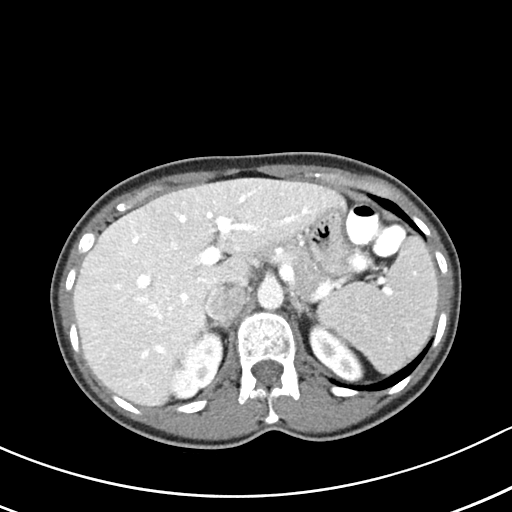
[im 72/76  soft-tissue]
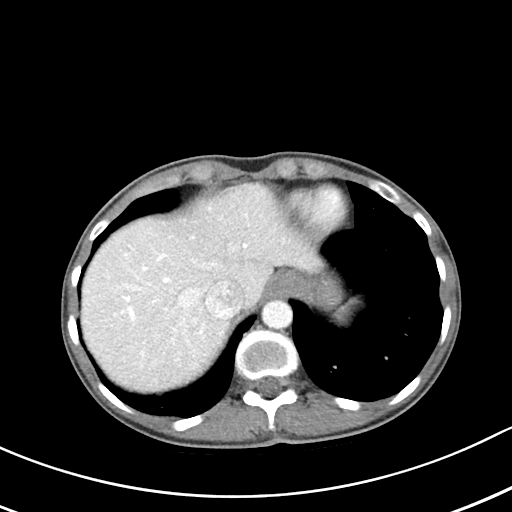

[Series 5: abd/pel w st · coronal · 0.55mm/px · 3 of 59 slices shown]
[im 20/59  soft-tissue]
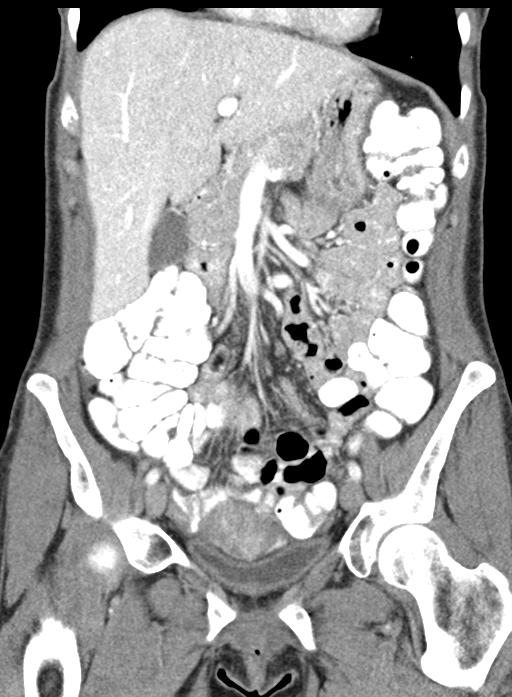
[im 26/59  soft-tissue]
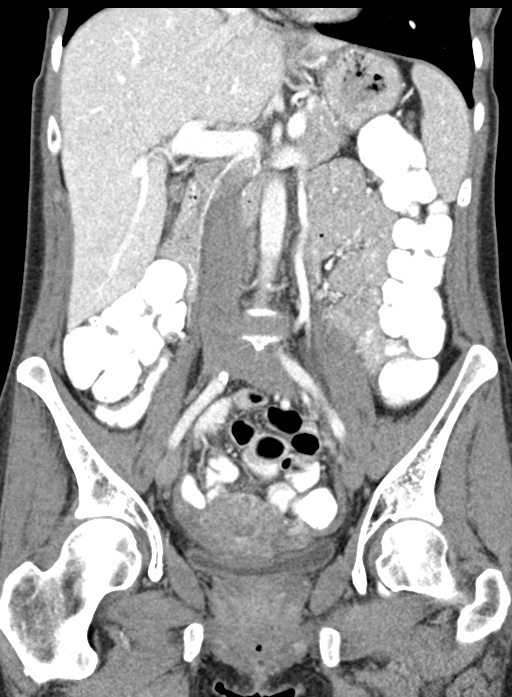
[im 33/59  soft-tissue]
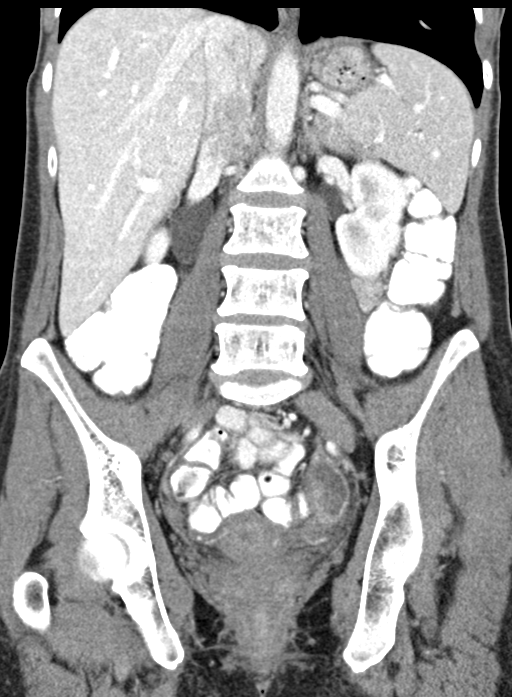

[15 of 46 positions shown; findings below may reference images not displayed]

FINDINGS: Lower Chest: No acute findings.

Hepatobiliary: No hepatic masses identified. Gallbladder is
unremarkable. No evidence of biliary ductal dilatation.

Pancreas:  No mass or inflammatory changes.

Spleen: Within normal limits in size and appearance.

Adrenals/Urinary Tract: No masses identified. No evidence of
hydronephrosis.

Stomach/Bowel: No evidence of obstruction, inflammatory process or
abnormal fluid collections.

Vascular/Lymphatic: No pathologically enlarged lymph nodes. No
abdominal aortic aneurysm.

Reproductive: A 1.4 cm well-circumscribed cystic area is seen in the
right fundal area the endometrial cavity. This could represent a
small loculated fluid collection from previous endometrial ablation,
however an early IUP cannot be excluded by CT. In addition, there is
a benign-appearing cyst in the left ovary which measures 3.3 x
cm,. This most likely represents a physiologic ovarian follicular or
corpus luteum cyst in a reproductive age female. No other masses
identified. No evidence of hemoperitoneum.

Other:  None.

Musculoskeletal:  No suspicious bone lesions identified.
IMPRESSION: 1. 1.4 cm well-circumscribed cystic area in the right fundal portion
of the endometrial cavity. This could represent a loculated
collection from previous endometrial ablation, however an early IUP
cannot be excluded by CT. Recommend correlation with the patient's
prior GYN surgical history, and consider pregnancy test to exclude
early IUP.
2. 3.3 cm benign-appearing left ovarian cyst, most likely a
physiologic ovarian follicular or corpus luteum cyst in a
reproductive age female.

These results will be called to the ordering clinician or
representative by the Radiologist Assistant, and communication
documented in the PACS or zVision Dashboard.

## 2021-08-28 ENCOUNTER — Other Ambulatory Visit: Payer: Self-pay | Admitting: Gastroenterology

## 2021-08-28 DIAGNOSIS — K922 Gastrointestinal hemorrhage, unspecified: Secondary | ICD-10-CM

## 2023-06-22 ENCOUNTER — Encounter: Payer: Self-pay | Admitting: Gastroenterology

## 2023-09-11 ENCOUNTER — Other Ambulatory Visit: Payer: Self-pay | Admitting: Orthopedic Surgery

## 2023-09-11 ENCOUNTER — Other Ambulatory Visit: Payer: Self-pay

## 2023-09-11 ENCOUNTER — Encounter (HOSPITAL_BASED_OUTPATIENT_CLINIC_OR_DEPARTMENT_OTHER): Payer: Self-pay | Admitting: Orthopedic Surgery

## 2023-09-13 ENCOUNTER — Ambulatory Visit (HOSPITAL_BASED_OUTPATIENT_CLINIC_OR_DEPARTMENT_OTHER): Payer: No Typology Code available for payment source | Admitting: Anesthesiology

## 2023-09-13 ENCOUNTER — Ambulatory Visit (HOSPITAL_BASED_OUTPATIENT_CLINIC_OR_DEPARTMENT_OTHER)
Admission: RE | Admit: 2023-09-13 | Discharge: 2023-09-13 | Disposition: A | Discharge status: Home / Self Care | Payer: No Typology Code available for payment source | Attending: Orthopedic Surgery | Admitting: Orthopedic Surgery

## 2023-09-13 ENCOUNTER — Encounter (HOSPITAL_BASED_OUTPATIENT_CLINIC_OR_DEPARTMENT_OTHER): Payer: Self-pay | Admitting: Orthopedic Surgery

## 2023-09-13 ENCOUNTER — Other Ambulatory Visit: Payer: Self-pay

## 2023-09-13 ENCOUNTER — Encounter (HOSPITAL_BASED_OUTPATIENT_CLINIC_OR_DEPARTMENT_OTHER)
Admission: RE | Disposition: A | Discharge status: Home / Self Care | Payer: Self-pay | Source: Home / Self Care | Attending: Orthopedic Surgery

## 2023-09-13 DIAGNOSIS — R569 Unspecified convulsions: Secondary | ICD-10-CM | POA: Insufficient documentation

## 2023-09-13 DIAGNOSIS — K219 Gastro-esophageal reflux disease without esophagitis: Secondary | ICD-10-CM | POA: Diagnosis not present

## 2023-09-13 DIAGNOSIS — Z01818 Encounter for other preprocedural examination: Secondary | ICD-10-CM

## 2023-09-13 DIAGNOSIS — F419 Anxiety disorder, unspecified: Secondary | ICD-10-CM | POA: Diagnosis not present

## 2023-09-13 DIAGNOSIS — R519 Headache, unspecified: Secondary | ICD-10-CM | POA: Diagnosis not present

## 2023-09-13 DIAGNOSIS — M67432 Ganglion, left wrist: Secondary | ICD-10-CM | POA: Insufficient documentation

## 2023-09-13 DIAGNOSIS — D6859 Other primary thrombophilia: Secondary | ICD-10-CM | POA: Diagnosis not present

## 2023-09-13 DIAGNOSIS — J45909 Unspecified asthma, uncomplicated: Secondary | ICD-10-CM | POA: Diagnosis not present

## 2023-09-13 HISTORY — PX: GANGLION CYST EXCISION: SHX1691

## 2023-09-13 SURGERY — EXCISION, GANGLION CYST, WRIST
Anesthesia: General | Site: Wrist | Laterality: Left

## 2023-09-13 MED ORDER — TRAMADOL HCL 50 MG PO TABS: ORAL_TABLET | ORAL | 0 refills | Status: AC

## 2023-09-13 MED ORDER — FENTANYL CITRATE (PF) 100 MCG/2ML IJ SOLN
INTRAMUSCULAR | Status: AC
  Filled 2023-09-13: qty 2

## 2023-09-13 MED ORDER — LACTATED RINGERS IV SOLN: INTRAVENOUS | Status: DC

## 2023-09-13 MED ORDER — BUPIVACAINE HCL (PF) 0.25 % IJ SOLN
INTRAMUSCULAR | Status: DC | PRN
  Administered 2023-09-13: 5 mL

## 2023-09-13 MED ORDER — ONDANSETRON HCL 4 MG/2ML IJ SOLN
INTRAMUSCULAR | Status: DC | PRN
  Administered 2023-09-13: 4 mg via INTRAVENOUS

## 2023-09-13 MED ORDER — CEFAZOLIN SODIUM-DEXTROSE 2-4 GM/100ML-% IV SOLN
INTRAVENOUS | Status: AC
  Filled 2023-09-13: qty 100

## 2023-09-13 MED ORDER — ACETAMINOPHEN 500 MG PO TABS
ORAL_TABLET | ORAL | Status: AC
  Filled 2023-09-13: qty 2

## 2023-09-13 MED ORDER — LIDOCAINE HCL (CARDIAC) PF 100 MG/5ML IV SOSY
PREFILLED_SYRINGE | INTRAVENOUS | Status: DC | PRN
  Administered 2023-09-13: 40 mg via INTRAVENOUS

## 2023-09-13 MED ORDER — CEFAZOLIN SODIUM-DEXTROSE 2-4 GM/100ML-% IV SOLN
2.0000 g | INTRAVENOUS | Status: AC
  Administered 2023-09-13: 2 g via INTRAVENOUS

## 2023-09-13 MED ORDER — FENTANYL CITRATE (PF) 100 MCG/2ML IJ SOLN
25.0000 ug | INTRAMUSCULAR | Status: DC | PRN
  Administered 2023-09-13 (×2): 25 ug via INTRAVENOUS

## 2023-09-13 MED ORDER — LIDOCAINE 2% (20 MG/ML) 5 ML SYRINGE
INTRAMUSCULAR | Status: DC | PRN
Start: 2023-09-13 — End: 2023-09-13
  Administered 2023-09-13: 60 mg via INTRAVENOUS

## 2023-09-13 MED ORDER — DEXAMETHASONE SODIUM PHOSPHATE 10 MG/ML IJ SOLN
INTRAMUSCULAR | Status: DC | PRN
  Administered 2023-09-13: 5 mg via INTRAVENOUS

## 2023-09-13 MED ORDER — MIDAZOLAM HCL 5 MG/5ML IJ SOLN
INTRAMUSCULAR | Status: DC | PRN
  Administered 2023-09-13: 2 mg via INTRAVENOUS

## 2023-09-13 MED ORDER — ACETAMINOPHEN 500 MG PO TABS
1000.0000 mg | ORAL_TABLET | Freq: Once | ORAL | Status: AC
  Administered 2023-09-13: 1000 mg via ORAL

## 2023-09-13 MED ORDER — PROPOFOL 10 MG/ML IV BOLUS
INTRAVENOUS | Status: DC | PRN
  Administered 2023-09-13: 200 mg via INTRAVENOUS

## 2023-09-13 MED ORDER — MIDAZOLAM HCL 2 MG/2ML IJ SOLN
INTRAMUSCULAR | Status: AC
  Filled 2023-09-13: qty 2

## 2023-09-13 MED ORDER — FENTANYL CITRATE (PF) 100 MCG/2ML IJ SOLN
INTRAMUSCULAR | Status: DC | PRN
  Administered 2023-09-13: 100 ug via INTRAVENOUS

## 2023-09-13 MED ORDER — OXYCODONE HCL 5 MG PO TABS
ORAL_TABLET | ORAL | Status: AC
  Filled 2023-09-13: qty 1

## 2023-09-13 MED ORDER — OXYCODONE HCL 5 MG PO TABS
5.0000 mg | ORAL_TABLET | Freq: Once | ORAL | Status: AC
  Administered 2023-09-13: 5 mg via ORAL

## 2023-09-13 SURGICAL SUPPLY — 51 items
APL PRP STRL LF DISP 70% ISPRP (MISCELLANEOUS) ×1
APL SKNCLS STERI-STRIP NONHPOA (GAUZE/BANDAGES/DRESSINGS)
BENZOIN TINCTURE PRP APPL 2/3 (GAUZE/BANDAGES/DRESSINGS) IMPLANT
BLADE MINI RND TIP GREEN BEAV (BLADE) IMPLANT
BLADE SURG 15 STRL LF DISP TIS (BLADE) ×2 IMPLANT
BLADE SURG 15 STRL SS (BLADE) ×2
BNDG CMPR 5X2 KNTD ELC UNQ LF (GAUZE/BANDAGES/DRESSINGS)
BNDG CMPR 5X3 KNIT ELC UNQ LF (GAUZE/BANDAGES/DRESSINGS) ×1
BNDG CMPR 9X4 STRL LF SNTH (GAUZE/BANDAGES/DRESSINGS)
BNDG ELASTIC 2INX 5YD STR LF (GAUZE/BANDAGES/DRESSINGS) IMPLANT
BNDG ELASTIC 3INX 5YD STR LF (GAUZE/BANDAGES/DRESSINGS) ×1 IMPLANT
BNDG ESMARK 4X9 LF (GAUZE/BANDAGES/DRESSINGS) IMPLANT
BNDG GAUZE DERMACEA FLUFF 4 (GAUZE/BANDAGES/DRESSINGS) ×1 IMPLANT
BNDG GZE DERMACEA 4 6PLY (GAUZE/BANDAGES/DRESSINGS) ×1
CHLORAPREP W/TINT 26 (MISCELLANEOUS) ×1 IMPLANT
CORD BIPOLAR FORCEPS 12FT (ELECTRODE) ×1 IMPLANT
COVER BACK TABLE 60X90IN (DRAPES) ×1 IMPLANT
COVER MAYO STAND STRL (DRAPES) ×1 IMPLANT
CUFF TOURN SGL QUICK 18X4 (TOURNIQUET CUFF) ×1 IMPLANT
DRAPE EXTREMITY T 121X128X90 (DISPOSABLE) ×1 IMPLANT
DRAPE SURG 17X23 STRL (DRAPES) ×1 IMPLANT
GAUZE PAD ABD 8X10 STRL (GAUZE/BANDAGES/DRESSINGS) IMPLANT
GAUZE SPONGE 4X4 12PLY STRL (GAUZE/BANDAGES/DRESSINGS) ×1 IMPLANT
GAUZE XEROFORM 1X8 LF (GAUZE/BANDAGES/DRESSINGS) ×1 IMPLANT
GLOVE BIO SURGEON STRL SZ7.5 (GLOVE) ×1 IMPLANT
GLOVE BIOGEL PI IND STRL 6.5 (GLOVE) IMPLANT
GLOVE BIOGEL PI IND STRL 8 (GLOVE) ×1 IMPLANT
GLOVE SURG SS PI 6.5 STRL IVOR (GLOVE) IMPLANT
GLOVE SURG SYN 7.5 E (GLOVE) ×1 IMPLANT
GLOVE SURG SYN 7.5 PF PI (GLOVE) IMPLANT
GOWN STRL REUS W/ TWL LRG LVL3 (GOWN DISPOSABLE) ×1 IMPLANT
GOWN STRL REUS W/TWL LRG LVL3 (GOWN DISPOSABLE) ×1
GOWN STRL REUS W/TWL XL LVL3 (GOWN DISPOSABLE) ×1 IMPLANT
NDL HYPO 25X1 1.5 SAFETY (NEEDLE) IMPLANT
NEEDLE HYPO 25X1 1.5 SAFETY (NEEDLE) IMPLANT
NS IRRIG 1000ML POUR BTL (IV SOLUTION) ×1 IMPLANT
PACK BASIN DAY SURGERY FS (CUSTOM PROCEDURE TRAY) ×1 IMPLANT
PAD CAST 3X4 CTTN HI CHSV (CAST SUPPLIES) IMPLANT
PADDING CAST ABS COTTON 4X4 ST (CAST SUPPLIES) ×1 IMPLANT
PADDING CAST COTTON 3X4 STRL (CAST SUPPLIES)
SPLINT PLASTER CAST XFAST 3X15 (CAST SUPPLIES) IMPLANT
STOCKINETTE 4X48 STRL (DRAPES) ×1 IMPLANT
STRIP CLOSURE SKIN 1/2X4 (GAUZE/BANDAGES/DRESSINGS) IMPLANT
SUT ETHILON 4 0 PS 2 18 (SUTURE) IMPLANT
SUT MNCRL AB 4-0 PS2 18 (SUTURE) IMPLANT
SUT MON AB 5-0 PS2 18 (SUTURE) IMPLANT
SUT VIC AB 4-0 P2 18 (SUTURE) IMPLANT
SYR BULB EAR ULCER 3OZ GRN STR (SYRINGE) ×1 IMPLANT
SYR CONTROL 10ML LL (SYRINGE) IMPLANT
TOWEL GREEN STERILE FF (TOWEL DISPOSABLE) ×2 IMPLANT
UNDERPAD 30X36 HEAVY ABSORB (UNDERPADS AND DIAPERS) ×1 IMPLANT

## 2023-09-13 NOTE — Anesthesia Postprocedure Evaluation (Signed)
Anesthesia Post Note  Patient: Michele Ali  Procedure(s) Performed: EXCISION OF DORSAL GANGLION CYST LEFT WRIST (Left: Wrist)     Patient location during evaluation: PACU Anesthesia Type: General Level of consciousness: awake and alert Pain management: pain level controlled Vital Signs Assessment: post-procedure vital signs reviewed and stable Respiratory status: spontaneous breathing, nonlabored ventilation, respiratory function stable and patient connected to nasal cannula oxygen Cardiovascular status: blood pressure returned to baseline and stable Postop Assessment: no apparent nausea or vomiting Anesthetic complications: no  No notable events documented.  Last Vitals:  Vitals:   09/13/23 1514 09/13/23 1534  BP: 137/76 (!) 147/89  Pulse: 79 80  Resp: 13 16  Temp:  (!) 36.4 C  SpO2: 100% 100%    Last Pain:  Vitals:   09/13/23 1538  TempSrc:   PainSc: 4                  Ladarrell Cornwall L Sinia Antosh

## 2023-09-13 NOTE — Anesthesia Procedure Notes (Signed)
Procedure Name: LMA Insertion Date/Time: 09/13/2023 1:53 PM  Performed by: Demetrio Lapping, CRNAPre-anesthesia Checklist: Patient identified, Emergency Drugs available, Suction available and Patient being monitored Patient Re-evaluated:Patient Re-evaluated prior to induction Oxygen Delivery Method: Circle System Utilized Preoxygenation: Pre-oxygenation with 100% oxygen Induction Type: IV induction Ventilation: Mask ventilation without difficulty LMA: LMA inserted LMA Size: 4.0 Number of attempts: 1 Airway Equipment and Method: Bite block Placement Confirmation: positive ETCO2 Tube secured with: Tape Dental Injury: Teeth and Oropharynx as per pre-operative assessment

## 2023-09-13 NOTE — Op Note (Signed)
NAME: Michele Ali MEDICAL RECORD NO: [id number] DATE OF BIRTH: 1971/03/02 FACILITY: [facility] LOCATION:  SURGERY CENTER PHYSICIAN: Tami Ribas, MD   OPERATIVE REPORT   DATE OF PROCEDURE: 09/13/23    PREOPERATIVE DIAGNOSIS: Left wrist dorsal ganglion cyst   POSTOPERATIVE DIAGNOSIS: Left wrist dorsal ganglion cyst   PROCEDURE: Excision of left wrist dorsal ganglion cyst   SURGEON:  [name], M.D.   ASSISTANT: none   ANESTHESIA:  General   INTRAVENOUS FLUIDS:  Per anesthesia flow sheet.   ESTIMATED BLOOD LOSS:  Minimal.   COMPLICATIONS:  None.   SPECIMENS: Left wrist ganglion to pathology   TOURNIQUET TIME:    Total Tourniquet Time Documented: Upper Arm (Left) - 17 minutes Total: Upper Arm (Left) - 17 minutes    DISPOSITION:  Stable to PACU.   INDICATIONS: 52 year old female is noted a mass on the dorsum of her left wrist.  It is bothersome to her.  She wishes to have it removed.  Risks, benefits and alternatives of surgery were discussed including the risks of blood loss, infection, damage to nerves, vessels, tendons, ligaments, bone for surgery, need for additional surgery, complications with wound healing, continued pain, stiffness, , recurrence.  She voiced understanding of these risks and elected to proceed.  OPERATIVE COURSE:  After being identified preoperatively by myself,  the patient and I agreed on the procedure and site of the procedure.  The surgical site was marked.  Surgical consent had been signed. Preoperative IV antibiotic prophylaxis was given. She was transferred to the operating room and placed on the operating table in supine position with the Left upper extremity on an arm board.  General anesthesia was induced by the anesthesiologist.  Left upper extremity was prepped and draped in normal sterile orthopedic fashion.  A surgical pause was performed between the surgeons, anesthesia, and operating room staff and all were in agreement as to  the patient, procedure, and site of procedure.  Tourniquet at the proximal aspect of the extremity was inflated to 250 mmHg after exsanguination of the arm with an Esmarch bandage.  Transverse incision was made over the mass on the dorsum of the wrist.  This is carried in subcutaneous tissues by spreading technique.  Bipolar electrocautery is used to obtain hemostasis.  The mass was carefully freed up from soft tissue attachments.  It was filled with clear gelatinous fluid.  There was a stalk coming from the dorsal aspect of the radiocarpal joint.  The mass was transected at stalk and mass sent to pathology for examination.  The base of the stalk was treated with bipolar electrocautery and a 4-0 Vicryl suture used in a figure-of-eight fashion to close the rent in the capsule.  The wound was copiously irrigated with sterile saline.  Inverted interrupted 4-0 Vicryl sutures were placed in subcutaneous tissues and skin was closed with a running subcuticular 5-0 Monocryl suture.  This was augmented with Steri-Strips.  The wound was injected with quarter percent plain Marcaine to aid in postoperative analgesia.  Was dressed with sterile 4 x 4's and ABD used as a splint.  This was wrapped with Kerlix and Ace bandage.  The tourniquet was deflated at 17 minutes.  Fingertips were pink with brisk capillary refill after deflation of tourniquet.  The operative  drapes were broken down.  The patient was awoken from anesthesia safely.  She was transferred back to the stretcher and taken to PACU in stable condition.  I will see her back in the office  in 1 week for postoperative followup.  I will give her a prescription for Tramadol 50 mg 1-2 tabs PO q6 hours prn pain, dispense # 20.   [name], MD Electronically signed, 09/13/23

## 2023-09-13 NOTE — Transfer of Care (Signed)
Immediate Anesthesia Transfer of Care Note  Patient: Michele Ali  Procedure(s) Performed: EXCISION OF DORSAL GANGLION CYST LEFT WRIST (Left: Wrist)  Patient Location: PACU  Anesthesia Type:General  Level of Consciousness: drowsy  Airway & Oxygen Therapy: Patient Spontanous Breathing and Patient connected to face mask oxygen  Post-op Assessment: Report given to RN and Post -op Vital signs reviewed and stable  Post vital signs: Reviewed and stable  Last Vitals:  Vitals Value Taken Time  BP 124/77 09/13/23 1433  Temp    Pulse 92 09/13/23 1441  Resp 16 09/13/23 1441  SpO2 100 % 09/13/23 1441  Vitals shown include unfiled device data.  Last Pain:  Vitals:   09/13/23 1433  TempSrc:   PainSc: Asleep      Patients Stated Pain Goal: 3 (09/13/23 1129)  Complications: No notable events documented.

## 2023-09-13 NOTE — Discharge Instructions (Addendum)
[facility] Instructions Hand Surgery  Wound Care: Keep your hand elevated above the level of your heart.  Do not allow it to dangle by your side.  Keep the dressing dry and do not remove it unless your doctor advises you to do so.  He will usually change it at the time of your post-op visit.  Moving your fingers is advised to stimulate circulation but will depend on the site of your surgery.  If you have a splint applied, your doctor will advise you regarding movement.  Activity: Do not drive or operate machinery today.  Rest today and then you may return to your normal activity and work as indicated by your physician.  Diet:  Drink liquids today or eat a light diet.  You may resume a regular diet tomorrow.    General expectations: Pain for two to three days. Fingers may become slightly swollen.  Call your doctor if any of the following occur: Severe pain not relieved by pain medication. Elevated temperature. Dressing soaked with blood. Inability to move fingers. White or bluish color to fingers.    Post Anesthesia Home Care Instructions  Activity: Get plenty of rest for the remainder of the day. A responsible individual must stay with you for 24 hours following the procedure.  For the next 24 hours, DO NOT: -Drive a car -Advertising copywriter -Drink alcoholic beverages -Take any medication unless instructed by your physician -Make any legal decisions or sign important papers.  Meals: Start with liquid foods such as gelatin or soup. Progress to regular foods as tolerated. Avoid greasy, spicy, heavy foods. If nausea and/or vomiting occur, drink only clear liquids until the nausea and/or vomiting subsides. Call your physician if vomiting continues.  Special Instructions/Symptoms: Your throat may feel dry or sore from the anesthesia or the breathing tube placed in your throat during surgery. If this causes discomfort, gargle with warm salt water. The discomfort should disappear  within 24 hours.  If you had a scopolamine patch placed behind your ear for the management of post- operative nausea and/or vomiting:  1. The medication in the patch is effective for 72 hours, after which it should be removed.  Wrap patch in a tissue and discard in the trash. Wash hands thoroughly with soap and water. 2. You may remove the patch earlier than 72 hours if you experience unpleasant side effects which may include dry mouth, dizziness or visual disturbances. 3. Avoid touching the patch. Wash your hands with soap and water after contact with the patch.    Next dose of tylenol if needed is after 5:30pm

## 2023-09-13 NOTE — Anesthesia Preprocedure Evaluation (Addendum)
Anesthesia Evaluation  Patient identified by MRN, date of birth, ID band Patient awake    Reviewed: Allergy & Precautions, NPO status , Patient's Chart, lab work & pertinent test results  Airway Mallampati: I  TM Distance: >3 FB Neck ROM: Full    Dental no notable dental hx. (+) Teeth Intact, Dental Advisory Given   Pulmonary asthma    Pulmonary exam normal breath sounds clear to auscultation       Cardiovascular negative cardio ROS Normal cardiovascular exam Rhythm:Regular Rate:Normal     Neuro/Psych  Headaches, Seizures -, Well Controlled,  PSYCHIATRIC DISORDERS Anxiety        GI/Hepatic Neg liver ROS,GERD  ,,  Endo/Other  negative endocrine ROS    Renal/GU negative Renal ROS  negative genitourinary   Musculoskeletal negative musculoskeletal ROS (+)    Abdominal   Peds  Hematology negative hematology ROS (+) Protein S deficiency   Anesthesia Other Findings   Reproductive/Obstetrics                             Anesthesia Physical Anesthesia Plan  ASA: 3  Anesthesia Plan: General   Post-op Pain Management: Tylenol PO (pre-op)*   Induction: Intravenous  PONV Risk Score and Plan: 3 and Ondansetron, Dexamethasone and Midazolam  Airway Management Planned: LMA  Additional Equipment:   Intra-op Plan:   Post-operative Plan: Extubation in OR  Informed Consent: I have reviewed the patients History and Physical, chart, labs and discussed the procedure including the risks, benefits and alternatives for the proposed anesthesia with the patient or authorized representative who has indicated his/her understanding and acceptance.     Dental advisory given  Plan Discussed with: CRNA  Anesthesia Plan Comments:        Anesthesia Quick Evaluation

## 2023-09-13 NOTE — H&P (Signed)
Michele Ali is an 52 y.o. female.   Chief Complaint: ganglion cyst HPI: 52 yo female with left wrist dorsal ganglion.  It is bothersome to her.  She wishes to have it removed.  Allergies:  Allergies  Allergen Reactions   Latex     Rash   Penicillins     Rash , stomach pain   Sulfa Antibiotics     Yeast infection    Past Medical History:  Diagnosis Date   Allergy    Anemia    Anxiety    Asthma    BRBPR (bright red blood per rectum) 05/2020   post polypectomy   Clotting disorder (HCC)    protien S deficiency    GERD (gastroesophageal reflux disease)    Migraine    Seizures (HCC)    childhood ages [age] on medication but none since then   Skin cancer    basal    Past Surgical History:  Procedure Laterality Date   ANKLE SURGERY Right    BASAL CELL CARCINOMA EXCISION     2016  left flank,   CERVICAL SPINE SURGERY  06/2017   C5-C7   COLONOSCOPY  05/20/2020   COLONOSCOPY WITH PROPOFOL N/A 05/21/2020   Procedure: COLONOSCOPY WITH PROPOFOL;  Surgeon: Napoleon Form, MD;  Location: [facility];  Service: Endoscopy;  Laterality: N/A;   ENDOMETRIAL ABLATION     EXPLORATORY LAPAROTOMY     HEMOSTASIS CLIP PLACEMENT  05/21/2020   Procedure: HEMOSTASIS CLIP PLACEMENT;  Surgeon: Napoleon Form, MD;  Location: [facility];  Service: Endoscopy;;   NECK SURGERY     "lumpectomy"   TUBAL LIGATION      Family History: Family History  Problem Relation Age of Onset   Crohn's disease Father    Dementia Father    Atrial fibrillation Father    Ulcerative colitis Father    Heart disease Mother    Diverticulitis Mother    Cancer Maternal Grandmother    Cancer Paternal Grandmother    Pancreatic disease Paternal Grandfather    Colon cancer Other    Esophageal cancer Neg Hx    Rectal cancer Neg Hx    Stomach cancer Neg Hx    Inflammatory bowel disease Neg Hx    Liver disease Neg Hx    Pancreatic cancer Neg Hx     Social History:   reports that she has never smoked. She  has never used smokeless tobacco. She reports current alcohol use. She reports that she does not use drugs.  Medications: Medications Prior to Admission  Medication Sig Dispense Refill   Cholecalciferol (VITAMIN D-1000 MAX ST) 1000 units tablet Take 1,000 Units by mouth daily.      clonazePAM (KLONOPIN) 0.5 MG tablet Take 0.5 mg by mouth 2 (two) times daily.      fluticasone (FLONASE) 50 MCG/ACT nasal spray Place 1 spray into the nose daily as needed for allergies or rhinitis.      loratadine (CLARITIN) 10 MG tablet Take 10 mg by mouth daily.     meloxicam (MOBIC) 15 MG tablet Take 7.5 mg by mouth 2 (two) times daily.     Multiple Vitamin (MULTI-VITAMINS) TABS Take 1 tablet by mouth daily.      omeprazole (PRILOSEC) 20 MG capsule Take 1 capsule (20 mg total) by mouth daily. 90 capsule 3   sertraline (ZOLOFT) 100 MG tablet Take 100 mg by mouth daily.     ALBUTEROL SULFATE HFA IN Inhale 1 puff into the lungs every 6 (  six) hours as needed (wheezing , shortness of breath.).      methocarbamol (ROBAXIN) 750 MG tablet Take 750 mg by mouth as needed for muscle spasms.       No results found for this or any previous visit (from the past 48 hour(s)).  No results found.    Blood pressure 118/76, pulse (!) 57, temperature 98.4 F (36.9 C), temperature source Temporal, resp. rate 20, height 5\' 2"  (1.575 m), weight 51.9 kg, SpO2 100%.  General appearance: alert, cooperative, and appears stated age Head: Normocephalic, without obvious abnormality, atraumatic Neck: supple, symmetrical, trachea midline Extremities: Intact sensation and capillary refill all digits.  +epl/fpl/io.  No wounds.  Pulses: 2+ and symmetric Skin: Skin color, texture, turgor normal. No rashes or lesions Neurologic: Grossly normal Incision/Wound: none  Assessment/Plan Left wrist dorsal ganglion.  Non operative and operative treatment options have been discussed with the patient and patient wishes to proceed with operative  treatment. Risks, benefits and alternatives of surgery were discussed including risks of blood loss, infection, damage to nerves/vessels/tendons/ligament/bone, failure of surgery, need for additional surgery, complication with wound healing, stiffness, recurrence.  She voiced understanding of these risks and elected to proceed.    [name] 09/13/2023, 1:23 PM

## 2023-09-14 ENCOUNTER — Encounter (HOSPITAL_BASED_OUTPATIENT_CLINIC_OR_DEPARTMENT_OTHER): Payer: Self-pay | Admitting: Orthopedic Surgery

## 2023-09-14 DIAGNOSIS — Z01818 Encounter for other preprocedural examination: Secondary | ICD-10-CM

## 2023-09-17 LAB — SURGICAL PATHOLOGY
